# Patient Record
Sex: Female | Born: 1955
Health system: Southern US, Community
[De-identification: ages and names within clinical notes are randomized; demographics above are authoritative.]

## PROBLEM LIST (undated history)

## (undated) DIAGNOSIS — I341 Nonrheumatic mitral (valve) prolapse: Secondary | ICD-10-CM

## (undated) DIAGNOSIS — E7800 Pure hypercholesterolemia, unspecified: Secondary | ICD-10-CM

## (undated) DIAGNOSIS — E785 Hyperlipidemia, unspecified: Secondary | ICD-10-CM

## (undated) DIAGNOSIS — I10 Essential (primary) hypertension: Secondary | ICD-10-CM

## (undated) DIAGNOSIS — N950 Postmenopausal bleeding: Secondary | ICD-10-CM

## (undated) HISTORY — DX: Essential (primary) hypertension: I10

## (undated) HISTORY — DX: Pure hypercholesterolemia, unspecified: E78.00

## (undated) HISTORY — PX: VEIN SURGERY: SHX48

## (undated) HISTORY — PX: ROOT CANAL: SHX2363

## (undated) HISTORY — DX: Nonrheumatic mitral (valve) prolapse: I34.1

## (undated) HISTORY — DX: Hyperlipidemia, unspecified: E78.5

## (undated) HISTORY — DX: Postmenopausal bleeding: N95.0

---

## 1998-12-07 ENCOUNTER — Other Ambulatory Visit: Admission: RE | Admit: 1998-12-07 | Discharge: 1998-12-07 | Payer: Self-pay | Admitting: Obstetrics and Gynecology

## 1999-11-05 ENCOUNTER — Other Ambulatory Visit: Admission: RE | Admit: 1999-11-05 | Discharge: 1999-11-05 | Payer: Self-pay | Admitting: Gynecology

## 2000-04-09 ENCOUNTER — Observation Stay (HOSPITAL_COMMUNITY): Admission: AD | Admit: 2000-04-09 | Discharge: 2000-04-09 | Payer: Self-pay | Admitting: Gynecology

## 2000-04-18 ENCOUNTER — Observation Stay (HOSPITAL_COMMUNITY): Admission: AD | Admit: 2000-04-18 | Discharge: 2000-04-19 | Payer: Self-pay | Admitting: Gynecology

## 2000-05-09 ENCOUNTER — Inpatient Hospital Stay (HOSPITAL_COMMUNITY): Admission: AD | Admit: 2000-05-09 | Discharge: 2000-05-09 | Payer: Self-pay | Admitting: Gynecology

## 2000-05-09 ENCOUNTER — Encounter: Payer: Self-pay | Admitting: Gynecology

## 2000-05-10 ENCOUNTER — Inpatient Hospital Stay (HOSPITAL_COMMUNITY): Admission: AD | Admit: 2000-05-10 | Discharge: 2000-05-13 | Payer: Self-pay | Admitting: Gynecology

## 2000-05-10 ENCOUNTER — Encounter (INDEPENDENT_AMBULATORY_CARE_PROVIDER_SITE_OTHER): Payer: Self-pay | Admitting: Specialist

## 2000-06-17 HISTORY — PX: TUBAL LIGATION: SHX77

## 2000-06-20 ENCOUNTER — Other Ambulatory Visit: Admission: RE | Admit: 2000-06-20 | Discharge: 2000-06-20 | Payer: Self-pay | Admitting: Gynecology

## 2001-01-27 DIAGNOSIS — I341 Nonrheumatic mitral (valve) prolapse: Secondary | ICD-10-CM

## 2001-01-27 HISTORY — DX: Nonrheumatic mitral (valve) prolapse: I34.1

## 2002-06-17 HISTORY — PX: BUNIONECTOMY: SHX129

## 2003-06-18 HISTORY — PX: DILATION AND CURETTAGE OF UTERUS: SHX78

## 2003-12-12 ENCOUNTER — Other Ambulatory Visit: Admission: RE | Admit: 2003-12-12 | Discharge: 2003-12-12 | Payer: Self-pay | Admitting: Family Medicine

## 2005-01-03 ENCOUNTER — Other Ambulatory Visit: Admission: RE | Admit: 2005-01-03 | Discharge: 2005-01-03 | Payer: Self-pay | Admitting: Family Medicine

## 2005-07-02 ENCOUNTER — Encounter: Payer: Self-pay | Admitting: Cardiovascular Disease

## 2006-01-13 ENCOUNTER — Other Ambulatory Visit: Admission: RE | Admit: 2006-01-13 | Discharge: 2006-01-13 | Payer: Self-pay | Admitting: Gynecology

## 2006-04-04 ENCOUNTER — Encounter (INDEPENDENT_AMBULATORY_CARE_PROVIDER_SITE_OTHER): Payer: Self-pay | Admitting: Specialist

## 2006-04-04 ENCOUNTER — Ambulatory Visit (HOSPITAL_BASED_OUTPATIENT_CLINIC_OR_DEPARTMENT_OTHER): Admission: RE | Admit: 2006-04-04 | Discharge: 2006-04-04 | Payer: Self-pay | Admitting: Gynecology

## 2009-01-17 ENCOUNTER — Other Ambulatory Visit: Admission: RE | Admit: 2009-01-17 | Discharge: 2009-01-17 | Payer: Self-pay | Admitting: Family Medicine

## 2009-08-28 ENCOUNTER — Other Ambulatory Visit: Admission: RE | Admit: 2009-08-28 | Discharge: 2009-08-28 | Payer: Self-pay | Admitting: Family Medicine

## 2010-06-06 ENCOUNTER — Ambulatory Visit: Payer: Self-pay | Admitting: Cardiovascular Disease

## 2010-11-02 NOTE — Discharge Summary (Signed)
[facility]  Patient:    Brooke [name], Brooke [name]                          MRN: [id number] Adm. Date:  04/10/00 Attending:  Gaetano Hawthorne. Lily Peer, M.D.                           Discharge Summary  OBSERVATION NOTE  HISTORY:                      The patient is a 55 year old gravida 5, para 3, Ab1, LMP July 25, 1999, with estimated date of confinement of November 14th and currently 37-2/[redacted] weeks gestation, who presented to [facility] at 2130 hours on October 24th complaining of contractions.  She was monitored. Contractions were found to be three and a half minutes apart and then one to nine minutes apart, with fetal heart rate of 130 to 140 beats per minute. Examination by the nurse in triage documented her cervix as 3-cm dilated, 50% effaced and questionable presentation and membranes intact.  HOSPITAL COURSE:              She was admitted and after ambulating on the ward, was monitored.  Her contractions began to decrease in intensity and frequency and throughout most of the night, her contractions averaged perhaps one to two per hour, very mild.  A reassuring fetal heart tracing was evident. On re-exam of her cervix this morning at 0500 hours, external os was 2-cm dilated, internal cervical os was closed, presentation was vertex and was ballotable and her vital signs were stable.  It was discussed with her that since she is not in labor, that we were planning on discharging her.  Due to the fact that she had positive group B strep status when she came in, if she were going to go into true labor, she was started on penicillin per protocol. Her IV fluids and antibiotics were discontinued this morning and she will be released home.  FOLLOWUP:                     She is to follow up in the office in 72 hours for a routine visit.  DISCHARGE INSTRUCTIONS:       If anything changes, she will return back to the office or to the hospital for remonitoring and  reexamination.  Labor precautions and instructions were provided. DD:  04/10/00 TD:  04/10/00 Job: [id number]

## 2010-11-02 NOTE — Discharge Summary (Signed)
[facility]  Patient:    Brooke [name], Brooke [name]                        MRN: [id number] Adm. Date:  [id number] Disc. Date: [id number] Attending:  Douglass Rivers Dictator:   Antony Contras, [facility]                           Discharge Summary  DISCHARGE DIAGNOSES:          Intrauterine pregnancy at 72 3/7 weeks, history of advanced maternal age.  PROCEDURE:                    Normal spontaneous vaginal delivery over intact perineum of viable infant, bilateral tubal sterilization.  HISTORY OF PRESENT ILLNESS:   Patient is a 55 year old gravida 5, para 3-0-1-3 with LMP July 25, 1999, [facility] April 30, 2000.  Prenatal risk factors include advanced maternal age.  Patient did decline amniocentesis, [facility]SA[facility].  PRENATAL LABORATORIES:        Blood type A-.  Antibody screen negative.  RPR, HBSAG, HIV nonreactive.  Rubella immune.  HOSPITAL COURSE:              Patient was admitted for induction of labor. She rapidly progressed to complete dilatation and delivered an Apgar 9/9 female infant over an intact perineum.  Nuchal cord x 1.  Particulate fluid. Pediatric team was in attendance.  Postpartum course was uncomplicated.  She did undergo postpartum sterilization on her first postpartum day which was performed by Dr. Farrel Gobble under general anesthesia.  Findings included normal tubes and ovaries.  Her postpartum course remained benign.  She remained afebrile.  She did require two Tylenol No. 3 for pain postoperative tubal sterilization, but was able to be discharged in satisfactory condition on her second postpartum day.  Baby was Rh negative so she did not need to receive RhoGAM.  LABORATORIES:                 CBC:  Hematocrit 31.1, hemoglobin 11.0, WBC 10.5, platelets 122.  DISPOSITION:                  Follow up in the office in six weeks.  Continue prenatal vitamins and iron.  Motrin, Tylox for pain. DD:  06/06/00 TD:  06/06/00 Job: [id number]

## 2010-11-02 NOTE — Op Note (Signed)
NAMEMARNEY, Brooke [name]                 ACCOUNT NO.:  [id number]   MEDICAL RECORD NO.:  [id number]          PATIENT TYPE:  AMB   LOCATION:  [facility]                         FACILITY:  [facility]   PHYSICIAN:  Timothy P. Fontaine, M.D.DATE OF BIRTH:  December 07, 1955   DATE OF PROCEDURE:  DATE OF DISCHARGE:                                 OPERATIVE REPORT   PREOPERATIVE DIAGNOSES:  1. Menorrhagia.  2. Endometrial polyp.   POSTOPERATIVE DIAGNOSES:  1. Menorrhagia.  2. Endometrial polyp.   PROCEDURE:  Hysteroscopy D&C and endometrial polypectomy resectoscopic.   SURGEON:  Fontaine.  Anesthetic general.   COMPLICATIONS:  None.   ESTIMATED BLOOD LOSS:  Minimal sorbitol discrepancy less than 30 mL   SPECIMEN:  1. Endometrial polyp.  2. Endometrial curetting.   FINDINGS:  EUA external BUS of vagina normal.  Cervix grossly normal.  Uterus normal size, shape and contour, retroverted, adnexa without masses.  Hysteroscopic large endometrial polyp posterior mid-endometrial cavity.  Hysteroscopy otherwise was normal.  Adequate noting right and left tubal  ostia fundus, anterior-posterior uterine surfaces, lower uterine segment  endocervical canal all visualized   PROCEDURE:  The patient was taken to the operating room having received SBE  antibiotic prophylaxsis, underwent general anesthesia was placed low dorsal  lithotomy position, received a perineal vaginal preparation with Betadine  solution.  Bladder emptied with in-and-out Foley catheterization.  EUA  performed.  Cervix was visualized with a weighted speculum, anterior lip  grasped with single-tooth tenaculum.  Cervix was gently gradually dilated to  admit the operative hysteroscope.  Hysteroscopy was performed with findings  noted above.  Using the right-angle resectoscope loop, the endometrial polyp  was resected at its base and then removed as a separate specimen, sent to  pathology.  A sharp curettage was then performed and the specimen again  was  sent to pathology.  Re-hysteroscopy showed an empty cavity, good distension,  no evidence of perforation, reported sorbitol discrepancy was 30 mL.  The  single-tooth tenaculum was removed.  There was some oozing at the tenaculum  site and a 3-0 chromic interrupted suture was placed to achieve hemostasis.  The weighted speculum was removed.  The patient placed in supine position,  awakened without difficulty, taken to recovery room in good condition having  tolerated the procedure well.      Timothy P. Fontaine, M.D.  Electronically Signed     TPF/MEDQ  D:  04/04/2006  T:  04/05/2006  Job:  [id number]

## 2010-11-02 NOTE — H&P (Signed)
Brooke [name], BAGHERI                 ACCOUNT NO.:  [id number]   MEDICAL RECORD NO.:  [id number]          PATIENT TYPE:  AMB   LOCATION:  [facility]                         FACILITY:  [facility]   PHYSICIAN:  Timothy P. Fontaine, M.D.DATE OF BIRTH:  1955/08/29   DATE OF ADMISSION:  04/04/2006  DATE OF DISCHARGE:                                HISTORY & PHYSICAL   CHIEF COMPLAINT:  Menorrhagia, endometrial polyp.   HISTORY OF PRESENT ILLNESS:  A 55 year old gravida 5, para 4, abortus 1  female with menorrhagia who underwent outpatient evaluation to include TSH  and prolactin which were normal, CBC which showed a hemoglobin of 13.4, and  a sonohysterogram which showed an endometrial polyp.  The patient is  admitted at this time for hysteroscopy, D&C, and removal of her polyp.   PAST MEDICAL HISTORY:  Elevated cholesterol, history of mitral valve  prolapse.   PAST SURGICAL HISTORY:  Tubal sterilization.   MEDICATIONS:  Lipitor, vitamin E, multivitamin.   REVIEW OF SYSTEMS:  Noncontributory.   FAMILY HISTORY:  Noncontributory.   SOCIAL HISTORY:  Noncontributory.   PHYSICAL EXAMINATION:  GENERAL:  Per anesthesia.  PELVIC:  External BUS and vagina normal.  Cervix normal.  Uterus normal  size, midline, and mobile, and nontender.  Adnexa without masses or  tenderness.   ASSESSMENT:  A 55 year old gravida 5, para 4, abortus 1 female with  increasing menorrhagia.  Sonohysterogram shows a clear endometrial polyp.  Options for management were reviewed with the patient as noted in my January 23, 2006, office note.  The patient was to proceed with hysteroscopy, D&C,  and removal of the polyp.  I reviewed with the patient the expected  intraoperative and postoperative courses, anesthesia, instrumentation, use  of the resectoscope, and sharp curettage.  The risks of bleeding,  transfusion, infection, uterine perforation, damage to internal organs  including bowel, bladder, ureters, vessels, and nerves  necessitating major  exploratory reparative surgeries and future reparative surgeries including  ostomy formation was all discussed, understood, and accepted.  The patient understands that her menorrhagia or irregular bleeding may  persist, change, or worsen following the procedure.  She understands and  accepts this.  The patient's questions are answered to her satisfaction and  she is ready to proceed with surgery.      Timothy P. Fontaine, M.D.  Electronically Signed     TPF/MEDQ  D:  04/01/2006  T:  04/01/2006  Job:  [id number]

## 2010-11-02 NOTE — Op Note (Signed)
[facility]  Patient:    Brooke [name] [name], Brooke [name]                        MRN: [id number] Proc. Date: 05/12/00 Adm. Date:  [id number] Attending:  Douglass Rivers                           Operative Report  PREOPERATIVE DIAGNOSES:       1. Postpartum.                               2. Undesired fertility.  POSTOPERATIVE DIAGNOSES:      1. Postpartum.                               2. Undesired fertility.  OPERATION:                    Bilateral tubal ligation, modified Pomeroy.  SURGEON:                      Douglass Rivers, M.D.  ASSISTANT:  ANESTHESIA:                   General anesthesia.  ESTIMATED BLOOD LOSS:         Minimal.  FINDINGS:                     Normal tubes and ovaries.  COMPLICATIONS:                None.  PATHOLOGY:                    Mid portion of left and right tube.  DESCRIPTION OF PROCEDURE:     The patient was taken to the operating room, placed in the supine position, prepped and draped in the usual sterile fashion.  Her epidural anesthesia had been dosed up; however, despite this she failed to have adequate anesthesia on the right side of the incision.  Based on this, she was then placed under general anesthesia.  An infraumbilical incision was made with the scalpel and carried through the underlying layer of fascia sharply.  The fascia was scored and the incision was extended laterally with the Mayo scissors. The peritoneal incision was also extended laterally. An examining finger was placed in the incision.  The uterus was palpated. The right tubo-ovarian ligament was brought to the incision and the tube was grasped with the Babcock, walked out to the fimbriated end and mid portion of the tube was then doubly tied with 2-0 plain and excised.  It was noted to be hemostatic. The endosalpinx was crossclamped and also excised in the similar fashion. The left tubo-ovarian ligament was identified. The tube was then walked out to the  fimbriated end. The mid portion of the tube was also doubly tied and excised. Both tubes were noted to be hemostatic prior to returning to the abdomen.  The fascia and peritoneum were closed in a running locked suture of 0 Vicryl.  The skin was closed with DermaBond. The incision was injected with 0.25% Marcaine solution. The patient tolerated the procedure well. Sponge, lap and needle counts correct x 2 and she was transferred to the PACU in stable condition. DD:  05/12/00 TD:  05/12/00 Job:  [id number]

## 2011-08-05 ENCOUNTER — Telehealth: Payer: Self-pay | Admitting: Cardiovascular Disease

## 2011-08-05 NOTE — Telephone Encounter (Signed)
New msg: Pt calling wanting to know if pt needs fasting lab work done prior to pt appt on 09/17/11. Please return pt call to discuss further.

## 2011-08-05 NOTE — Telephone Encounter (Signed)
Patient would like to know if she needs to have fasting labs prior O/V with Dr. Elease Hashimoto on 09/17/11. Pt does not remember if she has labs done prior visit . Patient is aware that this message will be send to MD's nurse to see what labs need to be done if any prior appointment.

## 2011-08-12 ENCOUNTER — Encounter: Payer: Self-pay | Admitting: *Deleted

## 2011-08-12 ENCOUNTER — Telehealth: Payer: Self-pay | Admitting: *Deleted

## 2011-08-12 DIAGNOSIS — I10 Essential (primary) hypertension: Secondary | ICD-10-CM

## 2011-08-12 DIAGNOSIS — I341 Nonrheumatic mitral (valve) prolapse: Secondary | ICD-10-CM

## 2011-08-12 DIAGNOSIS — I8390 Asymptomatic varicose veins of unspecified lower extremity: Secondary | ICD-10-CM

## 2011-08-12 DIAGNOSIS — E785 Hyperlipidemia, unspecified: Secondary | ICD-10-CM

## 2011-08-12 NOTE — Telephone Encounter (Signed)
Pt called/ labs ordered for yearly app.

## 2011-09-04 ENCOUNTER — Encounter: Payer: Self-pay | Admitting: *Deleted

## 2011-09-16 ENCOUNTER — Other Ambulatory Visit (INDEPENDENT_AMBULATORY_CARE_PROVIDER_SITE_OTHER): Payer: 59

## 2011-09-16 DIAGNOSIS — E785 Hyperlipidemia, unspecified: Secondary | ICD-10-CM

## 2011-09-16 DIAGNOSIS — I059 Rheumatic mitral valve disease, unspecified: Secondary | ICD-10-CM

## 2011-09-16 DIAGNOSIS — I341 Nonrheumatic mitral (valve) prolapse: Secondary | ICD-10-CM

## 2011-09-16 DIAGNOSIS — I10 Essential (primary) hypertension: Secondary | ICD-10-CM

## 2011-09-16 LAB — BASIC METABOLIC PANEL
CO2: 29 mEq/L (ref 19–32)
Calcium: 9.5 mg/dL (ref 8.4–10.5)
Creatinine, Ser: 0.8 mg/dL (ref 0.4–1.2)
Glucose, Bld: 96 mg/dL (ref 70–99)

## 2011-09-16 LAB — HEPATIC FUNCTION PANEL
AST: 34 U/L (ref 0–37)
Alkaline Phosphatase: 71 U/L (ref 39–117)
Total Bilirubin: 0.4 mg/dL (ref 0.3–1.2)

## 2011-09-16 LAB — LIPID PANEL: Total CHOL/HDL Ratio: 3

## 2011-09-17 ENCOUNTER — Ambulatory Visit (INDEPENDENT_AMBULATORY_CARE_PROVIDER_SITE_OTHER): Payer: 59 | Admitting: Cardiovascular Disease

## 2011-09-17 ENCOUNTER — Encounter: Payer: Self-pay | Admitting: Cardiovascular Disease

## 2011-09-17 VITALS — BP 123/85 | HR 62 | Ht 65.0 in | Wt 156.0 lb

## 2011-09-17 DIAGNOSIS — E785 Hyperlipidemia, unspecified: Secondary | ICD-10-CM

## 2011-09-17 DIAGNOSIS — I341 Nonrheumatic mitral (valve) prolapse: Secondary | ICD-10-CM

## 2011-09-17 DIAGNOSIS — I059 Rheumatic mitral valve disease, unspecified: Secondary | ICD-10-CM

## 2011-09-17 DIAGNOSIS — I10 Essential (primary) hypertension: Secondary | ICD-10-CM

## 2011-09-17 DIAGNOSIS — I8390 Asymptomatic varicose veins of unspecified lower extremity: Secondary | ICD-10-CM

## 2011-09-17 MED ORDER — ATORVASTATIN CALCIUM 20 MG PO TABS: 20.0000 mg | ORAL_TABLET | Freq: Every day | ORAL | Status: DC

## 2011-09-17 NOTE — Assessment & Plan Note (Signed)
Her mitral prolapse sounds very stable. We will anticipate doing another echocardiogram in a year or so.

## 2011-09-17 NOTE — Assessment & Plan Note (Signed)
I refilled her atorvastatin.  I'll see her again in one year. We'll check fasting labs at that time.

## 2011-09-17 NOTE — Progress Notes (Signed)
    Brooke White Date of Birth  September 25, 1955 [facility]     [place] [facility]  [address]    [address]   [address] [place], [place]  [address]    [place], [place]  [address] [phone]  Fax  [phone]  [phone]  Fax [phone]  Problem List: 1. Mitral Valve prolapse 2. Hyperlipidemia   History of Present Illness:  Brooke White is a 56 year old female with a history of mitral valve prolapse, hyperlipidemia, and hypertension. She's done very well since I last saw her a year and half ago. She has not had any episodes of chest pain or shortness of breath. She's been able to do all of her normal activities without any significant problems.  Current Outpatient Prescriptions on File Prior to Visit  Medication Sig Dispense Refill  . atorvastatin (LIPITOR) 20 MG tablet Take 20 mg by mouth daily.      Marland Kitchen DISCONTD: cetirizine (ZYRTEC) 10 MG tablet Take 10 mg by mouth as needed.        No Known Allergies  Past Medical History  Diagnosis Date  . MVP (mitral valve prolapse) 01/27/2001    LAST ECHO 07/02/2005 Impression normal LV function, mild MVP with trivial MR, mild tricuspid regurgitation, trivial pulmonic insufficiency.   Marland Kitchen HTN (hypertension)   . Hyperlipemia     No past surgical history on file.  History  Smoking status  . Never Smoker   Smokeless tobacco  . Not on file    History  Alcohol Use: Not on file    Family History  Problem Relation Age of Onset  . Hypertension    . Heart disease      Reviw of Systems:  Reviewed in the HPI.  All other systems are negative.  Physical Exam: Blood pressure 123/85, pulse 62, height 5\' 5"  (1.651 m), weight 156 lb (70.761 kg). General: Well developed, well nourished, in no acute distress.  Head: Normocephalic, atraumatic, sclera non-icteric, mucus membranes are moist,   Neck: Supple. Carotids are 2 + without bruits. No JVD  Lungs: Clear bilaterally to auscultation.  Heart: regular rate.  normal  S1 S2.  There is a very soft systolic murmur at the left sternal border.  Abdomen: Soft, non-tender, non-distended with normal bowel sounds. No hepatomegaly. No rebound/guarding. No masses.  Msk:  Strength and tone are normal  Extremities: No clubbing or cyanosis. No edema.  Distal pedal pulses are 2+ and equal bilaterally.  Neuro: Alert and oriented X 3. Moves all extremities spontaneously.  Psych:  Responds to questions appropriately with a normal affect.  ECG: Normal sinus rhythm at 66 beats a minute. She has poor R-wave progression which is likely due to lead placement.  Assessment / Plan:

## 2011-09-17 NOTE — Patient Instructions (Signed)
Your physician wants you to follow-up in: 1 YEAR You will receive a reminder letter in the mail two months in advance. If you don't receive a letter, please call our office to schedule the follow-up appointment.  

## 2012-04-27 LAB — HM PAP SMEAR: HM Pap smear: NEGATIVE

## 2012-04-27 LAB — TSH: TSH: 1.8 u[IU]/mL (ref <=5.90)

## 2012-06-17 DIAGNOSIS — N950 Postmenopausal bleeding: Secondary | ICD-10-CM

## 2012-06-17 HISTORY — DX: Postmenopausal bleeding: N95.0

## 2012-07-17 ENCOUNTER — Encounter (HOSPITAL_COMMUNITY): Payer: Self-pay | Admitting: Pharmacist

## 2012-07-18 HISTORY — PX: HYSTEROSCOPY: SHX211

## 2012-07-27 ENCOUNTER — Encounter (HOSPITAL_COMMUNITY): Payer: Self-pay | Admitting: *Deleted

## 2012-08-02 NOTE — H&P (Addendum)
Brooke White is an 57 y.o. female peri-menopausal with history of menorrhagia.  U/S with SHG notable for a posterior wall defect consistant with endometrial polyp measuring 2.6x1.3x2.4cm.  Pt has history of endometrial polyp resected in past.   Pertinent Gynecological History: Menses: irregular, with missed cycles Bleeding: dysfunctional uterine bleeding Contraception: abstinence and tubal ligation DES exposure: unknown Blood transfusions: none Sexually transmitted diseases: no past history Previous GYN Procedures: hysteroscopy, tubal ligation  Last mammogram: normal Date: 2011 Last pap: normal Date: 2013 OB History: G5, P4014   Menstrual History: Menarche age: 35  Patient's last menstrual period was 03/11/2012.    Past Medical History  Diagnosis Date  . MVP (mitral valve prolapse) 01/27/2001    LAST ECHO 07/02/2005 Impression normal LV function, mild MVP with trivial MR, mild tricuspid regurgitation, trivial pulmonic insufficiency.   . Hyperlipemia   . HTN (hypertension)     blood pressure under control-meds stopped approx 2011    Past Surgical History  Procedure Laterality Date  . Dilation and curettage of uterus  2005  . Bunionectomy  2004  . Tubal ligation  2002    Family History  Problem Relation Age of Onset  . Hypertension    . Heart disease      Social History:  reports that she has never smoked. She does not have any smokeless tobacco history on file. She reports that she does not drink alcohol or use illicit drugs.  Allergies: No Known Allergies  No prescriptions prior to admission    Review of Systems  Constitutional: Negative.   HENT: Negative.   Eyes: Negative.   Respiratory: Negative.   Cardiovascular: Negative.   Gastrointestinal: Negative.   Genitourinary: Negative.   Musculoskeletal: Negative.   Skin: Negative.   Neurological: Negative.   Endo/Heme/Allergies: Negative.   Psychiatric/Behavioral: Negative.     Height 5' 4.5" (1.638 m),  weight 68.04 kg (150 lb), last menstrual period 03/11/2012. Physical Exam  Constitutional: She is oriented to person, place, and time. She appears well-developed and well-nourished.  HENT:  Head: Normocephalic and atraumatic.  Eyes: Conjunctivae and EOM are normal. Pupils are equal, round, and reactive to light.  Neck: Normal range of motion. Neck supple.  Cardiovascular: Normal rate and regular rhythm.   Respiratory: Effort normal and breath sounds normal.  GI: Soft. Bowel sounds are normal.  Genitourinary: Rectum normal and vagina normal. No breast swelling, tenderness, discharge or bleeding. There is no rash, tenderness, lesion or injury on the right labia. There is no rash, tenderness, lesion or injury on the left labia. Uterus is not deviated, not enlarged and not tender. Cervix exhibits no motion tenderness and no discharge. Right adnexum displays no mass. Left adnexum displays no mass.  Musculoskeletal: Normal range of motion.  Lymphadenopathy:       Right: No inguinal adenopathy present.       Left: No inguinal adenopathy present.  Neurological: She is alert and oriented to person, place, and time. She has normal reflexes.  Skin: Skin is warm and dry.  Psychiatric: She has a normal mood and affect. Her behavior is normal. Judgment and thought content normal.    No results found for this or any previous visit (from the past 24 hour(s)).  No results found.  Assessment/Plan: Recurrent endometrial polyp, for resection.  Mild MVP with trivial regurgitaion  Naydeen Speirs H 08/02/2012, 9:39 PM No change in H&P

## 2012-08-03 ENCOUNTER — Encounter (HOSPITAL_COMMUNITY): Payer: Self-pay

## 2012-08-03 ENCOUNTER — Ambulatory Visit (HOSPITAL_COMMUNITY): Payer: 59

## 2012-08-03 ENCOUNTER — Encounter (HOSPITAL_COMMUNITY)
Admission: RE | Disposition: A | Discharge status: Home / Self Care | Payer: Self-pay | Source: Ambulatory Visit | Attending: Gynecology

## 2012-08-03 ENCOUNTER — Ambulatory Visit (HOSPITAL_COMMUNITY)
Admission: RE | Admit: 2012-08-03 | Discharge: 2012-08-03 | Disposition: A | Discharge status: Home / Self Care | Payer: 59 | Source: Ambulatory Visit | Attending: Gynecology | Admitting: Gynecology

## 2012-08-03 ENCOUNTER — Encounter (HOSPITAL_COMMUNITY): Payer: Self-pay | Admitting: *Deleted

## 2012-08-03 DIAGNOSIS — N950 Postmenopausal bleeding: Secondary | ICD-10-CM | POA: Insufficient documentation

## 2012-08-03 DIAGNOSIS — N840 Polyp of corpus uteri: Secondary | ICD-10-CM | POA: Insufficient documentation

## 2012-08-03 DIAGNOSIS — I059 Rheumatic mitral valve disease, unspecified: Secondary | ICD-10-CM | POA: Insufficient documentation

## 2012-08-03 HISTORY — PX: DILATATION & CURRETTAGE/HYSTEROSCOPY WITH RESECTOCOPE: SHX5572

## 2012-08-03 LAB — CBC
Hemoglobin: 14.3 g/dL (ref 12.0–15.0)
MCH: 28.5 pg (ref 26.0–34.0)
MCHC: 33.4 g/dL (ref 30.0–36.0)
Platelets: 158 10*3/uL (ref 150–400)
RBC: 5.01 MIL/uL (ref 3.87–5.11)

## 2012-08-03 SURGERY — DILATATION & CURETTAGE/HYSTEROSCOPY WITH RESECTOCOPE
Anesthesia: General | Site: Vagina | Wound class: Clean Contaminated

## 2012-08-03 MED ORDER — LIDOCAINE HCL (CARDIAC) 20 MG/ML IV SOLN
INTRAVENOUS | Status: AC
  Filled 2012-08-03: qty 5

## 2012-08-03 MED ORDER — BUPIVACAINE-EPINEPHRINE 0.25% -1:200000 IJ SOLN
INTRAMUSCULAR | Status: DC | PRN
  Administered 2012-08-03: 10 mL

## 2012-08-03 MED ORDER — MEPERIDINE HCL 25 MG/ML IJ SOLN: 6.2500 mg | INTRAMUSCULAR | Status: DC | PRN

## 2012-08-03 MED ORDER — PROPOFOL 10 MG/ML IV EMUL
INTRAVENOUS | Status: AC
  Filled 2012-08-03: qty 20

## 2012-08-03 MED ORDER — LACTATED RINGERS IV SOLN: INTRAVENOUS | Status: DC

## 2012-08-03 MED ORDER — MIDAZOLAM HCL 5 MG/5ML IJ SOLN
INTRAMUSCULAR | Status: DC | PRN
  Administered 2012-08-03: 2 mg via INTRAVENOUS

## 2012-08-03 MED ORDER — FENTANYL CITRATE 0.05 MG/ML IJ SOLN
INTRAMUSCULAR | Status: AC
  Filled 2012-08-03: qty 2

## 2012-08-03 MED ORDER — BUPIVACAINE HCL (PF) 0.25 % IJ SOLN
INTRAMUSCULAR | Status: AC
  Filled 2012-08-03: qty 30

## 2012-08-03 MED ORDER — ONDANSETRON HCL 4 MG/2ML IJ SOLN
INTRAMUSCULAR | Status: AC
  Filled 2012-08-03: qty 2

## 2012-08-03 MED ORDER — LACTATED RINGERS IV SOLN
INTRAVENOUS | Status: DC
  Administered 2012-08-03 (×2): via INTRAVENOUS

## 2012-08-03 MED ORDER — KETOROLAC TROMETHAMINE 30 MG/ML IJ SOLN
INTRAMUSCULAR | Status: AC
  Filled 2012-08-03: qty 1

## 2012-08-03 MED ORDER — MIDAZOLAM HCL 2 MG/2ML IJ SOLN: 0.5000 mg | Freq: Once | INTRAMUSCULAR | Status: DC | PRN

## 2012-08-03 MED ORDER — LIDOCAINE HCL (CARDIAC) 20 MG/ML IV SOLN
INTRAVENOUS | Status: DC | PRN
  Administered 2012-08-03: 40 mg via INTRAVENOUS
  Administered 2012-08-03 (×2): 30 mg via INTRAVENOUS

## 2012-08-03 MED ORDER — DEXAMETHASONE SODIUM PHOSPHATE 4 MG/ML IJ SOLN
INTRAMUSCULAR | Status: DC | PRN
  Administered 2012-08-03: 10 mg via INTRAVENOUS

## 2012-08-03 MED ORDER — LIDOCAINE HCL 2 % IJ SOLN
INTRAMUSCULAR | Status: AC
  Filled 2012-08-03: qty 20

## 2012-08-03 MED ORDER — PHENYLEPHRINE HCL 10 MG/ML IJ SOLN
INTRAMUSCULAR | Status: DC | PRN
  Administered 2012-08-03: 160 ug via INTRAVENOUS
  Administered 2012-08-03: 40 ug via INTRAVENOUS

## 2012-08-03 MED ORDER — EPHEDRINE 5 MG/ML INJ
INTRAVENOUS | Status: AC
  Filled 2012-08-03: qty 10

## 2012-08-03 MED ORDER — PROPOFOL 10 MG/ML IV EMUL
INTRAVENOUS | Status: DC | PRN
  Administered 2012-08-03: 180 mg via INTRAVENOUS

## 2012-08-03 MED ORDER — SUCCINYLCHOLINE CHLORIDE 20 MG/ML IJ SOLN
INTRAMUSCULAR | Status: DC | PRN
  Administered 2012-08-03: 40 mg via INTRAVENOUS

## 2012-08-03 MED ORDER — BUPIVACAINE-EPINEPHRINE PF 0.25-1:200000 % IJ SOLN
INTRAMUSCULAR | Status: AC
  Filled 2012-08-03: qty 30

## 2012-08-03 MED ORDER — ONDANSETRON HCL 4 MG/2ML IJ SOLN
INTRAMUSCULAR | Status: DC | PRN
  Administered 2012-08-03: 4 mg via INTRAVENOUS

## 2012-08-03 MED ORDER — PROMETHAZINE HCL 25 MG/ML IJ SOLN: 6.2500 mg | INTRAMUSCULAR | Status: DC | PRN

## 2012-08-03 MED ORDER — FENTANYL CITRATE 0.05 MG/ML IJ SOLN
INTRAMUSCULAR | Status: DC | PRN
  Administered 2012-08-03 (×2): 50 ug via INTRAVENOUS

## 2012-08-03 MED ORDER — FENTANYL CITRATE 0.05 MG/ML IJ SOLN: 25.0000 ug | INTRAMUSCULAR | Status: DC | PRN

## 2012-08-03 MED ORDER — EPHEDRINE SULFATE 50 MG/ML IJ SOLN
INTRAMUSCULAR | Status: DC | PRN
  Administered 2012-08-03: 20 mg via INTRAVENOUS

## 2012-08-03 MED ORDER — MIDAZOLAM HCL 2 MG/2ML IJ SOLN
INTRAMUSCULAR | Status: AC
  Filled 2012-08-03: qty 2

## 2012-08-03 MED ORDER — GLYCINE 1.5 % IR SOLN
Status: DC | PRN
  Administered 2012-08-03: 3000 mL

## 2012-08-03 MED ORDER — SILVER NITRATE-POT NITRATE 75-25 % EX MISC
CUTANEOUS | Status: AC
  Filled 2012-08-03: qty 10

## 2012-08-03 MED ORDER — PHENYLEPHRINE 40 MCG/ML (10ML) SYRINGE FOR IV PUSH (FOR BLOOD PRESSURE SUPPORT)
PREFILLED_SYRINGE | INTRAVENOUS | Status: AC
  Filled 2012-08-03: qty 5

## 2012-08-03 MED ORDER — DOXYCYCLINE HYCLATE 100 MG IV SOLR
100.0000 mg | Freq: Once | INTRAVENOUS | Status: AC
  Administered 2012-08-03: 100 mg via INTRAVENOUS
  Filled 2012-08-03: qty 100

## 2012-08-03 MED ORDER — KETOROLAC TROMETHAMINE 30 MG/ML IJ SOLN: 15.0000 mg | Freq: Once | INTRAMUSCULAR | Status: DC | PRN

## 2012-08-03 MED ORDER — SUCCINYLCHOLINE CHLORIDE 20 MG/ML IJ SOLN
INTRAMUSCULAR | Status: AC
  Filled 2012-08-03: qty 10

## 2012-08-03 MED ORDER — KETOROLAC TROMETHAMINE 30 MG/ML IJ SOLN
INTRAMUSCULAR | Status: DC | PRN
  Administered 2012-08-03: 30 mg via INTRAVENOUS

## 2012-08-03 SURGICAL SUPPLY — 15 items
CATH ROBINSON RED A/P 16FR (CATHETERS) ×2 IMPLANT
CONTAINER PREFILL 10% NBF 60ML (FORM) ×2 IMPLANT
DRESSING TELFA 8X3 (GAUZE/BANDAGES/DRESSINGS) ×2 IMPLANT
ELECT REM PT RETURN 9FT ADLT (ELECTROSURGICAL)
ELECTRODE REM PT RTRN 9FT ADLT (ELECTROSURGICAL) IMPLANT
GLOVE BIOGEL M 6.5 STRL (GLOVE) ×2 IMPLANT
GLOVE BIOGEL PI IND STRL 7.0 (GLOVE) ×1 IMPLANT
GLOVE BIOGEL PI INDICATOR 7.0 (GLOVE) ×1
GOWN STRL REIN XL XLG (GOWN DISPOSABLE) ×4 IMPLANT
LOOP ANGLED CUTTING 22FR (CUTTING LOOP) IMPLANT
NEEDLE HYPO 21X1.5 SAFETY (NEEDLE) ×2 IMPLANT
PACK HYSTEROSCOPY LF (CUSTOM PROCEDURE TRAY) ×2 IMPLANT
PAD OB MATERNITY 4.3X12.25 (PERSONAL CARE ITEMS) ×2 IMPLANT
TOWEL OR 17X24 6PK STRL BLUE (TOWEL DISPOSABLE) ×4 IMPLANT
WATER STERILE IRR 1000ML POUR (IV SOLUTION) ×2 IMPLANT

## 2012-08-03 NOTE — Anesthesia Preprocedure Evaluation (Addendum)
Anesthesia Evaluation  Patient identified by MRN, date of birth, ID band Patient awake    Reviewed: Allergy & Precautions, H&P , Patient's Chart, lab work & pertinent test results, reviewed documented beta blocker date and time   History of Anesthesia Complications Negative for: history of anesthetic complications  Airway Mallampati: III TM Distance: <3 FB Neck ROM: full  Mouth opening: Limited Mouth Opening  Dental no notable dental hx.    Pulmonary neg pulmonary ROS,  breath sounds clear to auscultation  Pulmonary exam normal       Cardiovascular Exercise Tolerance: Good hypertension, negative cardio ROS  Rhythm:regular Rate:Normal     Neuro/Psych negative neurological ROS  negative psych ROS   GI/Hepatic negative GI ROS, Neg liver ROS,   Endo/Other  negative endocrine ROS  Renal/GU negative Renal ROS     Musculoskeletal   Abdominal   Peds  Hematology negative hematology ROS (+)   Anesthesia Other Findings MVP (mitral valve prolapse) 01/27/2001 LAST ECHO 07/02/2005 Impression normal LV function, mild MVP with trivial MR, mild tricuspid regurgitation, trivial pulmonic insufficiency.  Hyperlipemia        HTN (hypertension)   blood pressure under control-meds stopped approx 2011    Reproductive/Obstetrics negative OB ROS                          Anesthesia Physical Anesthesia Plan  ASA: II  Anesthesia Plan: General LMA   Post-op Pain Management:    Induction:   Airway Management Planned:   Additional Equipment:   Intra-op Plan:   Post-operative Plan:   Informed Consent: I have reviewed the patients History and Physical, chart, labs and discussed the procedure including the risks, benefits and alternatives for the proposed anesthesia with the patient or authorized representative who has indicated his/her understanding and acceptance.   Dental Advisory Given  Plan Discussed with:  CRNA and Surgeon  Anesthesia Plan Comments:         Anesthesia Quick Evaluation

## 2012-08-03 NOTE — Op Note (Signed)
NAMEHUDSYN, BARICH                 ACCOUNT NO.:  [id number]  MEDICAL RECORD NO.:  [id number]  LOCATION:  [facility]                          FACILITY:  [facility]  PHYSICIAN:  Ivor Costa. Farrel Gobble, M.D. DATE OF BIRTH:  December 12, 1955  DATE OF PROCEDURE:  08/03/2012 DATE OF DISCHARGE:  08/03/2012                              OPERATIVE REPORT   PREOPERATIVE DIAGNOSIS:  Postmenopausal bleeding.  POSTOPERATIVE DIAGNOSIS:  Postmenopausal bleeding.  PROCEDURE:  Hysteroscopy/polypectomy.  SURGEON:  Ivor Costa. Farrel Gobble, MD  ANESTHESIA:  General.  IV FLUIDS:  900 mL.  URINE OUTPUT:  Not measured.  I AND O DEFICIT:  75 mL.  FINDINGS:  Retroverted uterus, large polypoid mass, consuming the cavity and a smaller mass by the tubal ostia on the right.  PATHOLOGY:  Polyps.  DESCRIPTION OF PROCEDURE:  The patient was taken to the operating room. General anesthesia was induced, placed in dorsal lithotomy position. Prepped and draped in usual sterile fashion.  A bimanual exam was performed.  The orientation of the uterus was confirmed.  A sterile weighted speculum was placed into the vagina.  The cervix was stabilized with a single-tooth tenaculum and the paracervical block with 10 mL of 0.25% Marcaine with epinephrine was placed.  The speculum was then changed to bivalve.  The uterus sounded to 8.  The cervix was dilated from Cytotec on the day before.  A diagnostic scope was able to be advanced through the cervix and then to the cavity.  The findings were as noted above.  The scope was switched out to the operative resectoscope.  Poor seeding limited visualization, while the scope was being replaced, the polyp forceps and then a curette was advanced through the cavity.  The curette revealed a single large specimen, consistent with a polyp.  The diagnostic scope was then advanced back through the cervix.  The bases of the polyp were visualized and noted to be hemostatic.  The smaller polyp that was noted by  the ostia was grasped with a polyp forceps in the operative port. The instruments were then removed.  The uterus was noted to be unremarkable as was the cervix.  There was small amount of bleeding on the anterior lip of the cervix that was treated with gentle pressure.  The patient tolerated the procedure well.  Sponge, lap, needle counts were correct x2.  She was given doxycycline intraoperatively and transferred to the PACU in stable condition.     Ivor Costa. Farrel Gobble, M.D.     THL/MEDQ  D:  08/03/2012  T:  08/03/2012  Job:  [id number]

## 2012-08-03 NOTE — Brief Op Note (Signed)
08/03/2012  10:30 AM  PATIENT:  Brooke White  57 y.o. female  PRE-OPERATIVE DIAGNOSIS:  PMB  POST-OPERATIVE DIAGNOSIS:  PMB  PROCEDURE:  Procedure(s): /HYSTEROSCOPY WITH RESECTOCOPE (N/A)  SURGEON:  Surgeon(s) and Role:    * Bennye Alm, MD - Primary  PHYSICIAN ASSISTANT:   ASSISTANTS: none   ANESTHESIA:   general  EBL:  Total I/O In: 900 [I.V.:900] Out: -   BLOOD ADMINISTERED:none  DRAINS: none   LOCAL MEDICATIONS USED:  MARCAINE    and Amount: 10 ml  SPECIMEN:  Source of Specimen:  uterus  DISPOSITION OF SPECIMEN:  PATHOLOGY   COUNTS:  YES  TOURNIQUET:  * No tourniquets in log *  DICTATION: .Other Dictation: Dictation Number [id number]  PLAN OF CARE: Discharge to home after PACU  PATIENT DISPOSITION:  PACU - hemodynamically stable.   Delay start of Pharmacological VTE agent (>24hrs) due to surgical blood loss or risk of bleeding: not applicable

## 2012-08-03 NOTE — Anesthesia Postprocedure Evaluation (Signed)
  Anesthesia Post Note  Patient: Brooke White  Procedure(s) Performed: Procedure(s) (LRB): /HYSTEROSCOPY WITH RESECTOCOPE (N/A)  Anesthesia type: GA  Patient location: PACU  Post pain: Pain level controlled  Post assessment: Post-op Vital signs reviewed  Last Vitals:  Filed Vitals:   08/03/12 0818  BP: 138/93  Pulse: 74  Temp: 36.7 C  Resp: 18    Post vital signs: Reviewed  Level of consciousness: sedated  Complications: No apparent anesthesia complications

## 2012-08-03 NOTE — Transfer of Care (Signed)
Immediate Anesthesia Transfer of Care Note  Patient: Brooke White  Procedure(s) Performed: Procedure(s): /HYSTEROSCOPY WITH RESECTOCOPE (N/A)  Patient Location: PACU  Anesthesia Type:General  Level of Consciousness: awake, alert , oriented and patient cooperative  Airway & Oxygen Therapy: Patient Spontanous Breathing and Patient connected to nasal cannula oxygen  Post-op Assessment: Report given to PACU RN and Post -op Vital signs reviewed and stable  Post vital signs: Reviewed and stable  Complications: No apparent anesthesia complications

## 2012-08-04 ENCOUNTER — Encounter (HOSPITAL_COMMUNITY): Payer: Self-pay | Admitting: Gynecology

## 2012-08-20 ENCOUNTER — Encounter: Payer: Self-pay | Admitting: Gynecology

## 2012-08-20 DIAGNOSIS — N950 Postmenopausal bleeding: Secondary | ICD-10-CM | POA: Insufficient documentation

## 2012-09-23 ENCOUNTER — Other Ambulatory Visit: Payer: Self-pay | Admitting: *Deleted

## 2012-09-23 DIAGNOSIS — E785 Hyperlipidemia, unspecified: Secondary | ICD-10-CM

## 2012-09-23 MED ORDER — ATORVASTATIN CALCIUM 20 MG PO TABS: 20.0000 mg | ORAL_TABLET | Freq: Every day | ORAL | Status: DC

## 2012-09-28 ENCOUNTER — Other Ambulatory Visit: Payer: Self-pay

## 2012-09-28 DIAGNOSIS — Z1231 Encounter for screening mammogram for malignant neoplasm of breast: Secondary | ICD-10-CM

## 2012-10-26 ENCOUNTER — Ambulatory Visit
Admission: RE | Admit: 2012-10-26 | Discharge: 2012-10-26 | Disposition: A | Discharge status: Home / Self Care | Payer: 59 | Source: Ambulatory Visit

## 2012-10-26 DIAGNOSIS — Z1231 Encounter for screening mammogram for malignant neoplasm of breast: Secondary | ICD-10-CM

## 2012-10-27 ENCOUNTER — Other Ambulatory Visit: Payer: Self-pay | Admitting: Gynecology

## 2012-10-27 DIAGNOSIS — R928 Other abnormal and inconclusive findings on diagnostic imaging of breast: Secondary | ICD-10-CM

## 2012-10-28 ENCOUNTER — Other Ambulatory Visit: Payer: Self-pay | Admitting: Gynecology

## 2012-10-30 ENCOUNTER — Encounter: Payer: Self-pay | Admitting: Obstetrics and Gynecology

## 2012-10-30 ENCOUNTER — Telehealth: Payer: Self-pay | Admitting: Gynecology

## 2012-10-30 ENCOUNTER — Ambulatory Visit (INDEPENDENT_AMBULATORY_CARE_PROVIDER_SITE_OTHER): Payer: 59 | Admitting: Obstetrics and Gynecology

## 2012-10-30 VITALS — BP 128/80 | Wt 159.0 lb

## 2012-10-30 DIAGNOSIS — L293 Anogenital pruritus, unspecified: Secondary | ICD-10-CM

## 2012-10-30 DIAGNOSIS — N950 Postmenopausal bleeding: Secondary | ICD-10-CM

## 2012-10-30 DIAGNOSIS — D251 Intramural leiomyoma of uterus: Secondary | ICD-10-CM

## 2012-10-30 DIAGNOSIS — N390 Urinary tract infection, site not specified: Secondary | ICD-10-CM

## 2012-10-30 DIAGNOSIS — N898 Other specified noninflammatory disorders of vagina: Secondary | ICD-10-CM

## 2012-10-30 LAB — POCT URINALYSIS DIPSTICK
Blood, UA: 250
Glucose, UA: NEGATIVE
Urobilinogen, UA: NEGATIVE
pH, UA: 6

## 2012-10-30 MED ORDER — FLUCONAZOLE 150 MG PO TABS: 150.0000 mg | ORAL_TABLET | Freq: Once | ORAL | Status: DC

## 2012-10-30 NOTE — Telephone Encounter (Signed)
PATIENT STATES CONTINUE TO HAVE BLEEDING FROM PROCEDURE DONE LAST MONTH. APPOINTMENT GIVEN FOR DR. Tresa Res TODAY @ 2:00PM

## 2012-10-30 NOTE — Progress Notes (Addendum)
57 yo MWF G5P4 s/p resection of a large endometrial polyp by Dr. Farrel Gobble in Feb 2014 .  Three weeks ago started having vag bleeding after sex and it has never stopped.  Also c/o urinary urgency and vulvar irritation that to her feels like a yeast infection.  Some lower back pain for the last 2 days like she is going to start a period.  The bleeding has never been really heavy, but it is certainly not going away either.  Started Azo probiotics about three weeks ago but that hasn't helped bleeding.    U/A neg except for blood (vaginal bleeding currently)  Exam:  WF in NAD     Neg CVAT     Abd soft, flat, NT, no masses     Pelvic:  Ext appears nl, BUS neg, Vag looks nl, cx nl w/o active bleeding.  Pink vag discharge when vag is wiped with a large swab.  BM:  Uterus is RF, NT, nl size but the fundus feels firm and heavy c/w fibroid.  Review of previous records reveals that in Dec 2013 she had a PUS which showed two fibroids, one 3 cm and the other 1.6 cm with a uterine size of 9 x 6 x 6 cm.  (G5P4)  At that time she also had the large endometrial polyp which was later resected by Dr. Farrel Gobble.  A:  Post meno bleeding, recurrent after hysteroscopic resection of polyps      Fundal fibroid, probably the cause of this bleeding      prob yeast vaginitis by sx's  P:  Discussed situation with patient.  Clearly she has no cancer causing the bleeding so I reassured her about that.  THe bleeding is likely from the fibroid which at age almost 71 if it is causing bleeding now it will probably continue to do so.  Options would be either patience and conservative management, or hysterectomy.  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  She will discuss with her husband and let me know if she wants to proceed.    RX:  Diflucan 150 mg x1 for presumed yeast vaginitis.

## 2012-10-30 NOTE — Telephone Encounter (Signed)
Pt has been bleeding for 3weeks. Please call to schedule an appt. She is available this afternoon after 2  Or Thursday afternoon after 1 or 2pm.

## 2012-10-30 NOTE — Patient Instructions (Signed)
Let me know if you wish to proceed with surgery.

## 2012-11-02 ENCOUNTER — Encounter: Payer: Self-pay | Admitting: Cardiovascular Disease

## 2012-11-04 ENCOUNTER — Telehealth: Payer: Self-pay | Admitting: Obstetrics and Gynecology

## 2012-11-04 NOTE — Telephone Encounter (Signed)
Patient made it clear that she was specifically returning a phone call to Kennon Rounds in regards to making an appointment.

## 2012-11-04 NOTE — Telephone Encounter (Signed)
She returned Sally's phone call about scheduling her hysterectomy.

## 2012-11-04 NOTE — Telephone Encounter (Signed)
Female states she is not available, LM i was returning her call and can call back tomm after 9am

## 2012-11-05 ENCOUNTER — Ambulatory Visit
Admission: RE | Admit: 2012-11-05 | Discharge: 2012-11-05 | Disposition: A | Discharge status: Home / Self Care | Payer: 59 | Source: Ambulatory Visit | Attending: Gynecology | Admitting: Gynecology

## 2012-11-05 DIAGNOSIS — R928 Other abnormal and inconclusive findings on diagnostic imaging of breast: Secondary | ICD-10-CM

## 2012-11-05 NOTE — Telephone Encounter (Signed)
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  She states she does not care if Dr Tresa Res or Dr Farrel Gobble does case, date is more important to her and Dr Farrel Gobble did her initial surgery. She saw Dr Tresa Res due to her concern about bleeding. Ok to proceed with scheduling and what type of hysterectomy?

## 2012-11-05 NOTE — Telephone Encounter (Signed)
Yes, fine to schedule the surgery with Dr. Farrel Gobble.  Ask her what type of hysterectomy.

## 2012-11-06 NOTE — Telephone Encounter (Signed)
Robotic hyst with tubes

## 2012-11-19 ENCOUNTER — Encounter: Payer: Self-pay | Admitting: Gynecology

## 2012-11-20 ENCOUNTER — Telehealth: Payer: Self-pay | Admitting: Gynecology

## 2012-11-20 NOTE — Telephone Encounter (Signed)
LM that I am still working on scheduling surgery.  Date patient requested is not available at hospital (12-07-12).  Currently scheduled for 12-29-12, please call me on Monday to discuss.

## 2012-11-20 NOTE — Telephone Encounter (Signed)
Patient is calling regarding her appointment her surgery (hyst)

## 2012-11-23 NOTE — Telephone Encounter (Signed)
Patient left message on my VM to call her back at home. Call to patient and left message again that requested date was not available and currently scheduled for 7--15-14.  Need to discuss with her f this will workl. She would be second case that day.LMTCB.

## 2012-11-23 NOTE — Telephone Encounter (Signed)
See next telephone note for scheduling info.

## 2012-11-24 NOTE — Telephone Encounter (Signed)
Patient returning Sally's call. °

## 2012-11-25 ENCOUNTER — Telehealth: Payer: Self-pay | Admitting: *Deleted

## 2012-11-25 NOTE — Telephone Encounter (Signed)
Surgery date of 12-29-12 discussed with patient and she is aggreable.  Surgery instructions reviewed and mailed to patient. Pre/post op appts scheduled.

## 2012-11-25 NOTE — Telephone Encounter (Signed)
See next telephone note ?

## 2012-12-02 ENCOUNTER — Telehealth: Payer: Self-pay | Admitting: *Deleted

## 2012-12-09 ENCOUNTER — Encounter: Payer: Self-pay | Admitting: Cardiovascular Disease

## 2012-12-09 ENCOUNTER — Ambulatory Visit (INDEPENDENT_AMBULATORY_CARE_PROVIDER_SITE_OTHER): Payer: 59 | Admitting: Cardiovascular Disease

## 2012-12-09 ENCOUNTER — Telehealth: Payer: Self-pay | Admitting: Gynecology

## 2012-12-09 VITALS — BP 130/100 | HR 70 | Ht 64.0 in | Wt 158.0 lb

## 2012-12-09 DIAGNOSIS — E785 Hyperlipidemia, unspecified: Secondary | ICD-10-CM

## 2012-12-09 DIAGNOSIS — I059 Rheumatic mitral valve disease, unspecified: Secondary | ICD-10-CM

## 2012-12-09 DIAGNOSIS — I341 Nonrheumatic mitral (valve) prolapse: Secondary | ICD-10-CM

## 2012-12-09 DIAGNOSIS — I10 Essential (primary) hypertension: Secondary | ICD-10-CM

## 2012-12-09 LAB — BASIC METABOLIC PANEL
BUN: 17 mg/dL (ref 6–23)
Chloride: 104 mEq/L (ref 96–112)
Creatinine, Ser: 0.8 mg/dL (ref 0.4–1.2)
GFR: 74.81 mL/min (ref >60.00)
Glucose, Bld: 113 mg/dL — ABNORMAL HIGH (ref 70–99)
Potassium: 4.7 mEq/L (ref 3.5–5.1)

## 2012-12-09 LAB — LIPID PANEL
LDL Cholesterol: 91 mg/dL (ref 0–99)
VLDL: 23.6 mg/dL (ref 0.0–40.0)

## 2012-12-09 LAB — HEPATIC FUNCTION PANEL
Alkaline Phosphatase: 74 U/L (ref 39–117)
Bilirubin, Direct: 0.1 mg/dL (ref 0.0–0.3)
Total Bilirubin: 0.8 mg/dL (ref 0.3–1.2)

## 2012-12-09 NOTE — Telephone Encounter (Signed)
LMTCB to discuss insurance benefits for upcoming surgery.

## 2012-12-09 NOTE — Assessment & Plan Note (Signed)
We'll check her lipids today.

## 2012-12-09 NOTE — Assessment & Plan Note (Signed)
Her mitral valve prolapse is stable. We'll continue to follow her on a yearly basis.

## 2012-12-09 NOTE — Progress Notes (Signed)
Brooke White Date of Birth  September 11, 1955 [facility]     [place] [facility]  [address]    [address]   [address] [place], [place]  [address]    [place], [place]  [address] [phone]  Fax  [phone]  [phone]  Fax [phone]  Problem List: 1. Mitral Valve prolapse 2. Hyperlipidemia   History of Present Illness:  Brooke White is a 57 year old female with a history of mitral valve prolapse, hyperlipidemia, and hypertension. She's done very well since I last saw her a year and half ago. She has not had any episodes of chest pain or shortness of breath. She's been able to do all of her normal activities without any significant problems.  December 09, 2012:  No new cardiac complaints.  She is having a hysterectomy.  She has uterine polyps and is having lots of bleeding.   She has a grandson as of last October.  She is not exercising much recently.    Current Outpatient Prescriptions on File Prior to Visit  Medication Sig Dispense Refill  . atorvastatin (LIPITOR) 20 MG tablet Take 1 tablet (20 mg total) by mouth daily.  30 tablet  3  . calcium carbonate (TUMS - DOSED IN MG ELEMENTAL CALCIUM) 500 MG chewable tablet Chew 1 tablet by mouth daily.      . cetirizine (ZYRTEC) 10 MG tablet Take 10 mg by mouth daily as needed. For allergies      . Multiple Vitamins-Minerals (MULTIVITAMIN WITH MINERALS) tablet Take 1 tablet by mouth daily.      . Phenazopyridine HCl (AZO-STANDARD PO) Take by mouth.       No current facility-administered medications on file prior to visit.    No Known Allergies  Past Medical History  Diagnosis Date  . MVP (mitral valve prolapse) 01/27/2001    LAST ECHO 07/02/2005 Impression normal LV function, mild MVP with trivial MR, mild tricuspid regurgitation, trivial pulmonic insufficiency.   . Hyperlipemia   . HTN (hypertension)     blood pressure under control-meds stopped approx 2011  . Post-menopausal bleeding 2014    endometrial polyp  .  Elevated cholesterol     Past Surgical History  Procedure Laterality Date  . Dilation and curettage of uterus  2005  . Bunionectomy  2004  . Tubal ligation  2002  . Dilatation & currettage/hysteroscopy with resectocope N/A 08/03/2012    Procedure: Melton Krebs WITH RESECTOCOPE;  Surgeon: Bennye Alm, MD;  Location: [facility];  Service: Gynecology;  Laterality: N/A;  . Hysteroscopy  07/2012    2007   . Root canal    . Vein surgery Bilateral     History  Smoking status  . Never Smoker   Smokeless tobacco  . Never Used    History  Alcohol Use No    Family History  Problem Relation Age of Onset  . Hypertension    . Heart disease      Reviw of Systems:  Reviewed in the HPI.  All other systems are negative.  Physical Exam: Blood pressure 130/100, pulse 70, height 5\' 4"  (1.626 m), weight 158 lb (71.668 kg), last menstrual period 03/15/2012. General: Well developed, well nourished, in no acute distress.  Head: Normocephalic, atraumatic, sclera non-icteric, mucus membranes are moist,   Neck: Supple. Carotids are 2 + without bruits. No JVD  Lungs: Clear bilaterally to auscultation.  Heart: regular rate.  normal  S1 S2. There is a very soft systolic murmur at the left  sternal border.  Abdomen: Soft, non-tender, non-distended with normal bowel sounds. No hepatomegaly. No rebound/guarding. No masses.  Msk:  Strength and tone are normal  Extremities: No clubbing or cyanosis. No edema.  Distal pedal pulses are 2+ and equal bilaterally.  Neuro: Alert and oriented X 3. Moves all extremities spontaneously.  Psych:  Responds to questions appropriately with a normal affect.  ECG: December 09, 2012:    Normal sinus rhythm at 66 beats a minute. She has poor R-wave progression which is likely due to lead placement.  Assessment / Plan:

## 2012-12-09 NOTE — Patient Instructions (Addendum)
Your physician wants you to follow-up in: 1 YEAR  You will receive a reminder letter in the mail two months in advance. If you don't receive a letter, please call our office to schedule the follow-up appointment.  Your physician recommends that you return for lab work in: TODAY    .REDUCE HIGH SODIUM FOODS LIKE CANNED SOUP, GRAVY, SAUCES, READY PREPARED FOODS LIKE FROZEN FOODS; LEAN CUISINE, LASAGNA. BACON, SAUSAGE, LUNCH MEAT, FAST FOODS..HOTDOGS AND CHIPS  DASH Diet The DASH diet stands for "Dietary Approaches to Stop Hypertension." It is a healthy eating plan that has been shown to reduce high blood pressure (hypertension) in as little as 14 days, while also possibly providing other significant health benefits. These other health benefits include reducing the risk of breast cancer after menopause and reducing the risk of type 2 diabetes, heart disease, colon cancer, and stroke. Health benefits also include weight loss and slowing kidney failure in patients with chronic kidney disease.  DIET GUIDELINES  Limit salt (sodium). Your diet should contain less than 1500 mg of sodium daily.  Limit refined or processed carbohydrates. Your diet should include mostly whole grains. Desserts and added sugars should be used sparingly.  Include small amounts of heart-healthy fats. These types of fats include nuts, oils, and tub margarine. Limit saturated and trans fats. These fats have been shown to be harmful in the body. CHOOSING FOODS  The following food groups are based on a 2000 calorie diet. See your Registered Dietitian for individual calorie needs. Grains and Grain Products (6 to 8 servings daily)  Eat More Often: Whole-wheat bread, brown rice, whole-grain or wheat pasta, quinoa, popcorn without added fat or salt (air popped).  Eat Less Often: White bread, white pasta, white rice, cornbread. Vegetables (4 to 5 servings daily)  Eat More Often: Fresh, frozen, and canned vegetables. Vegetables may  be raw, steamed, roasted, or grilled with a minimal amount of fat.  Eat Less Often/Avoid: Creamed or fried vegetables. Vegetables in a cheese sauce. Fruit (4 to 5 servings daily)  Eat More Often: All fresh, canned (in natural juice), or frozen fruits. Dried fruits without added sugar. One hundred percent fruit juice ( cup [237 mL] daily).  Eat Less Often: Dried fruits with added sugar. Canned fruit in light or heavy syrup. [employer], Fish, and Poultry (2 servings or less daily. One serving is 3 to 4 oz [85-114 g]).  Eat More Often: Ninety percent or leaner ground beef, tenderloin, sirloin. Round cuts of beef, chicken breast, [place] breast. All fish. Grill, bake, or broil your meat. Nothing should be fried.  Eat Less Often/Avoid: Fatty cuts of meat, [place], or chicken leg, thigh, or wing. Fried cuts of meat or fish. Dairy (2 to 3 servings)  Eat More Often: Low-fat or fat-free milk, low-fat plain or light yogurt, reduced-fat or part-skim cheese.  Eat Less Often/Avoid: Milk (whole, 2%).Whole milk yogurt. Full-fat cheeses. Nuts, Seeds, and Legumes (4 to 5 servings per week)  Eat More Often: All without added salt.  Eat Less Often/Avoid: Salted nuts and seeds, canned beans with added salt. Fats and Sweets (limited)  Eat More Often: Vegetable oils, tub margarines without trans fats, sugar-free gelatin. Mayonnaise and salad dressings.  Eat Less Often/Avoid: Coconut oils, palm oils, butter, stick margarine, cream, half and half, cookies, candy, pie. FOR MORE INFORMATION The Dash Diet Eating Plan: www.dashdiet.org Document Released: 05/23/2011 Document Revised: 08/26/2011 Document Reviewed: 05/23/2011 [facility] Patient Information 2014 [place], [place].

## 2012-12-11 ENCOUNTER — Ambulatory Visit (INDEPENDENT_AMBULATORY_CARE_PROVIDER_SITE_OTHER): Payer: 59 | Admitting: Gynecology

## 2012-12-11 VITALS — BP 128/78 | HR 70 | Resp 14 | Ht 64.0 in | Wt 166.0 lb

## 2012-12-11 DIAGNOSIS — D251 Intramural leiomyoma of uterus: Secondary | ICD-10-CM

## 2012-12-11 DIAGNOSIS — Z01818 Encounter for other preprocedural examination: Secondary | ICD-10-CM

## 2012-12-11 DIAGNOSIS — N950 Postmenopausal bleeding: Secondary | ICD-10-CM

## 2012-12-11 MED ORDER — HYDROMORPHONE HCL 2 MG PO TABS: 2.0000 mg | ORAL_TABLET | ORAL | Status: DC | PRN

## 2012-12-11 MED ORDER — CELECOXIB 200 MG PO CAPS: 200.0000 mg | ORAL_CAPSULE | Freq: Two times a day (BID) | ORAL | Status: DC

## 2012-12-11 NOTE — Patient Instructions (Signed)
Simethicone twice a day ducolax  Stay on stool softener for 3d after last narcotic miralax non-gas producing Clears day before OR-jello, tea, broth celebrex 2 tab morning of OR with sips, then 1 pill twice a day for 2d

## 2012-12-11 NOTE — Progress Notes (Signed)
57 y.o.MarriedNot Hispanic or Latinofemale presents for preoperative consult for robotic hysterectomy with bilateral salpingectomy for abnormal uterine bleeding and fibroids  .  Pertinent items are noted in HPI. BP 128/78  Pulse 70  Resp 14  Ht 5\' 4"  (1.626 m)  Wt 166 lb (75.297 kg)  BMI 28.48 kg/m2  LMP 03/15/2012 General appearance: alert, cooperative and appears stated age Lungs: clear to auscultation bilaterally Heart: regular rate and rhythm, S1, S2 normal, no murmur, click, rub or gallop Abdomen: soft, non-tender; bowel sounds normal; no masses,  no organomegaly Pelvic: cervix normal in appearance, external genitalia normal, no adnexal masses or tenderness, no cervical motion tenderness, uterus normal size, shape, and consistency and vagina normal without discharge Extremities: extremities normal, atraumatic, no cyanosis or edema Skin: Skin color, texture, turgor normal. No rashes or lesions Lymph nodes: negative   The procedure was discussed at length, including the expected post-operative course. The ACOG handoutwas not given to the patientprior to the consult.  Pt informed regarding risks and benefits of surgery, including but not limited to bleeding, infections, damage to bowel, bladder and major vessels either during port placement or during the surgical procedure.  Patient was made aware that damage that is seen immediately would be addressed at the time and may require conversion to an open procedure and additional surgical consults.  In addition some damage is delayed and may require return to the operating room in the future.    Risk of blood transfusion from discussed, including the risk of infection from receiving blood was discussed.  It was recommended that the patient does not donate autologously.  The patientwill be accept blood products if deemed necessary.  There is a risk of formation of a deep vein thrombus in the extremities was discussed, although PAS will be  placed to minimize the risk, the patient was informed that a pulmonary embolism could still form and could result in death.  Early ambulation was stressed.  She was instructed on signs and symptoms to be aware of and the need to call if they should develop. In addition the risk of bowel herniation was discussed and signs to be aware of reviewed  All questions were addressed.  Post-operative medications dilaudid, celebrex were given to the patient. She was instructed for clears the day before OR

## 2012-12-14 ENCOUNTER — Encounter (HOSPITAL_COMMUNITY): Payer: Self-pay

## 2012-12-14 NOTE — Telephone Encounter (Signed)
Patient was given a list of medications, patient is not sure when to take these meds (before or after surgery).

## 2012-12-15 NOTE — Telephone Encounter (Signed)
Patient had questions concerning OTC meds she purchased per advise of Dr. Farrel Gobble. Instructions given for Simethicone and Miralax to start after surgery at home and stool softeners to start after surgery at home. patient understands.

## 2012-12-28 ENCOUNTER — Encounter (HOSPITAL_COMMUNITY): Payer: Self-pay

## 2012-12-28 ENCOUNTER — Encounter (HOSPITAL_COMMUNITY)
Admission: RE | Admit: 2012-12-28 | Discharge: 2012-12-28 | Disposition: A | Discharge status: Home / Self Care | Payer: 59 | Source: Ambulatory Visit | Attending: Gynecology | Admitting: Gynecology

## 2012-12-28 LAB — CBC
Hemoglobin: 14.7 g/dL (ref 12.0–15.0)
MCH: 28.9 pg (ref 26.0–34.0)
MCV: 86.1 fL (ref 78.0–100.0)
RBC: 5.09 MIL/uL (ref 3.87–5.11)

## 2012-12-28 NOTE — Patient Instructions (Addendum)
22 Brooke White  12/28/2012   Your procedure is scheduled on:  12/29/12  Enter through the Main Entrance of [facility] at 1200 Noon  Pick up the phone at the desk and dial [phone].   Call this number if you have problems the morning of surgery: [phone]   Remember:   Do not eat food:After Midnight.  Do not drink clear liquids: 6 Hours before arrival.  Take these medicines the morning of surgery with A SIP OF WATER: NA   Do not wear jewelry, make-up or nail polish.  Do not wear lotions, powders, or perfumes. You may wear deodorant.  Do not shave 48 hours prior to surgery.  Do not bring valuables to the hospital.  [facility] is not responsible                  for any belongings or valuables brought to the hospital.  Contacts, dentures or bridgework may not be worn into surgery.  Leave suitcase in the car. After surgery it may be brought to your room.  For patients admitted to the hospital, checkout time is 11:00 AM the day of                discharge.   Patients discharged the day of surgery will not be allowed to drive                   home.  Name and phone number of your driver: NA  Special Instructions: Shower using CHG 2 nights before surgery and the night before surgery.  If you shower the day of surgery use CHG.  Use special wash - you have one bottle of CHG for all showers.  You should use approximately 1/3 of the bottle for each shower.   Please read over the following fact sheets that you were given: Surgical Site Infection Prevention

## 2012-12-29 ENCOUNTER — Ambulatory Visit (HOSPITAL_COMMUNITY)
Admission: RE | Admit: 2012-12-29 | Discharge: 2012-12-30 | Disposition: A | Discharge status: Home / Self Care | Payer: 59 | Source: Ambulatory Visit | Attending: Gynecology | Admitting: Gynecology

## 2012-12-29 ENCOUNTER — Encounter (HOSPITAL_COMMUNITY): Payer: Self-pay | Admitting: Anesthesiology

## 2012-12-29 ENCOUNTER — Ambulatory Visit (HOSPITAL_COMMUNITY): Payer: 59 | Admitting: Anesthesiology

## 2012-12-29 ENCOUNTER — Encounter (HOSPITAL_COMMUNITY)
Admission: RE | Disposition: A | Discharge status: Home / Self Care | Payer: Self-pay | Source: Ambulatory Visit | Attending: Gynecology

## 2012-12-29 DIAGNOSIS — Z9889 Other specified postprocedural states: Secondary | ICD-10-CM

## 2012-12-29 DIAGNOSIS — D251 Intramural leiomyoma of uterus: Secondary | ICD-10-CM

## 2012-12-29 DIAGNOSIS — N950 Postmenopausal bleeding: Secondary | ICD-10-CM

## 2012-12-29 DIAGNOSIS — N938 Other specified abnormal uterine and vaginal bleeding: Secondary | ICD-10-CM | POA: Insufficient documentation

## 2012-12-29 DIAGNOSIS — N949 Unspecified condition associated with female genital organs and menstrual cycle: Secondary | ICD-10-CM | POA: Insufficient documentation

## 2012-12-29 HISTORY — PX: CYSTOSCOPY: SHX5120

## 2012-12-29 HISTORY — PX: BILATERAL SALPINGECTOMY: SHX5743

## 2012-12-29 HISTORY — PX: ROBOTIC ASSISTED TOTAL HYSTERECTOMY: SHX6085

## 2012-12-29 LAB — TYPE AND SCREEN: Antibody Screen: NEGATIVE

## 2012-12-29 LAB — ABO/RH: ABO/RH(D): A NEG

## 2012-12-29 SURGERY — ROBOTIC ASSISTED TOTAL HYSTERECTOMY
Anesthesia: General | Site: Abdomen | Wound class: Clean Contaminated

## 2012-12-29 MED ORDER — LIDOCAINE HCL (CARDIAC) 20 MG/ML IV SOLN
INTRAVENOUS | Status: AC
  Filled 2012-12-29: qty 5

## 2012-12-29 MED ORDER — GLYCOPYRROLATE 0.2 MG/ML IJ SOLN
INTRAMUSCULAR | Status: AC
  Filled 2012-12-29: qty 3

## 2012-12-29 MED ORDER — HYDROMORPHONE HCL PF 1 MG/ML IJ SOLN
0.2000 mg | INTRAMUSCULAR | Status: DC | PRN
  Administered 2012-12-29: 0.6 mg via INTRAVENOUS
  Filled 2012-12-29: qty 1

## 2012-12-29 MED ORDER — MIDAZOLAM HCL 2 MG/2ML IJ SOLN
INTRAMUSCULAR | Status: AC
  Filled 2012-12-29: qty 2

## 2012-12-29 MED ORDER — DEXAMETHASONE SODIUM PHOSPHATE 10 MG/ML IJ SOLN
INTRAMUSCULAR | Status: AC
  Filled 2012-12-29: qty 1

## 2012-12-29 MED ORDER — GLYCOPYRROLATE 0.2 MG/ML IJ SOLN
INTRAMUSCULAR | Status: AC
  Filled 2012-12-29: qty 4

## 2012-12-29 MED ORDER — PROPOFOL 10 MG/ML IV EMUL
INTRAVENOUS | Status: AC
  Filled 2012-12-29: qty 20

## 2012-12-29 MED ORDER — ONDANSETRON HCL 4 MG/2ML IJ SOLN: 4.0000 mg | Freq: Four times a day (QID) | INTRAMUSCULAR | Status: DC | PRN

## 2012-12-29 MED ORDER — LIDOCAINE HCL (CARDIAC) 20 MG/ML IV SOLN
INTRAVENOUS | Status: DC | PRN
  Administered 2012-12-29: 50 mg via INTRAVENOUS

## 2012-12-29 MED ORDER — HYDROMORPHONE HCL PF 1 MG/ML IJ SOLN
INTRAMUSCULAR | Status: AC
  Filled 2012-12-29: qty 1

## 2012-12-29 MED ORDER — MENTHOL 3 MG MT LOZG: 1.0000 | LOZENGE | OROMUCOSAL | Status: DC | PRN

## 2012-12-29 MED ORDER — FENTANYL CITRATE 0.05 MG/ML IJ SOLN
INTRAMUSCULAR | Status: AC
  Filled 2012-12-29: qty 5

## 2012-12-29 MED ORDER — PROPOFOL 10 MG/ML IV BOLUS
INTRAVENOUS | Status: DC | PRN
  Administered 2012-12-29: 150 mg via INTRAVENOUS
  Administered 2012-12-29: 50 mg via INTRAVENOUS

## 2012-12-29 MED ORDER — FENTANYL CITRATE 0.05 MG/ML IJ SOLN
INTRAMUSCULAR | Status: AC
  Filled 2012-12-29: qty 2

## 2012-12-29 MED ORDER — ACETAMINOPHEN 10 MG/ML IV SOLN
INTRAVENOUS | Status: AC
  Filled 2012-12-29: qty 100

## 2012-12-29 MED ORDER — FENTANYL CITRATE 0.05 MG/ML IJ SOLN
INTRAMUSCULAR | Status: DC | PRN
  Administered 2012-12-29: 50 ug via INTRAVENOUS
  Administered 2012-12-29: 100 ug via INTRAVENOUS
  Administered 2012-12-29: 50 ug via INTRAVENOUS
  Administered 2012-12-29: 150 ug via INTRAVENOUS

## 2012-12-29 MED ORDER — LACTATED RINGERS IV SOLN
INTRAVENOUS | Status: DC
  Administered 2012-12-29 (×3): via INTRAVENOUS

## 2012-12-29 MED ORDER — ROPIVACAINE HCL 5 MG/ML IJ SOLN
INTRAMUSCULAR | Status: DC | PRN
  Administered 2012-12-29: 70 mL

## 2012-12-29 MED ORDER — DEXAMETHASONE SODIUM PHOSPHATE 10 MG/ML IJ SOLN
INTRAMUSCULAR | Status: DC | PRN
  Administered 2012-12-29: 10 mg via INTRAVENOUS

## 2012-12-29 MED ORDER — ROCURONIUM BROMIDE 100 MG/10ML IV SOLN
INTRAVENOUS | Status: DC | PRN
  Administered 2012-12-29: 10 mg via INTRAVENOUS
  Administered 2012-12-29: 50 mg via INTRAVENOUS
  Administered 2012-12-29: 10 mg via INTRAVENOUS
  Administered 2012-12-29: 20 mg via INTRAVENOUS

## 2012-12-29 MED ORDER — ONDANSETRON HCL 4 MG PO TABS: 4.0000 mg | ORAL_TABLET | Freq: Four times a day (QID) | ORAL | Status: DC | PRN

## 2012-12-29 MED ORDER — DOCUSATE SODIUM 100 MG PO CAPS
100.0000 mg | ORAL_CAPSULE | Freq: Two times a day (BID) | ORAL | Status: DC
  Administered 2012-12-29 – 2012-12-30 (×2): 100 mg via ORAL
  Filled 2012-12-29 (×2): qty 1

## 2012-12-29 MED ORDER — INDIGOTINDISULFONATE SODIUM 8 MG/ML IJ SOLN
INTRAMUSCULAR | Status: DC | PRN
  Administered 2012-12-29: 3 mL via INTRAVENOUS

## 2012-12-29 MED ORDER — DEXTROSE 5 % IV SOLN
2.0000 g | INTRAVENOUS | Status: DC
  Filled 2012-12-29: qty 2

## 2012-12-29 MED ORDER — TRAMADOL HCL 50 MG PO TABS
50.0000 mg | ORAL_TABLET | Freq: Four times a day (QID) | ORAL | Status: DC | PRN
  Administered 2012-12-30 (×2): 50 mg via ORAL
  Filled 2012-12-29 (×2): qty 1

## 2012-12-29 MED ORDER — NEOSTIGMINE METHYLSULFATE 1 MG/ML IJ SOLN
INTRAMUSCULAR | Status: AC
  Filled 2012-12-29: qty 1

## 2012-12-29 MED ORDER — LACTATED RINGERS IR SOLN
Status: DC | PRN
  Administered 2012-12-29: 3000 mL

## 2012-12-29 MED ORDER — ONDANSETRON HCL 4 MG/2ML IJ SOLN
INTRAMUSCULAR | Status: AC
Start: 2012-12-29 — End: 2012-12-29
  Filled 2012-12-29: qty 2

## 2012-12-29 MED ORDER — DEXTROSE 5 % IV SOLN
2.0000 g | INTRAVENOUS | Status: DC | PRN
  Administered 2012-12-29: 2 g via INTRAVENOUS

## 2012-12-29 MED ORDER — ROPIVACAINE HCL 5 MG/ML IJ SOLN
INTRAMUSCULAR | Status: AC
  Filled 2012-12-29: qty 60

## 2012-12-29 MED ORDER — ROCURONIUM BROMIDE 50 MG/5ML IV SOLN
INTRAVENOUS | Status: AC
  Filled 2012-12-29: qty 1

## 2012-12-29 MED ORDER — HYDROMORPHONE HCL PF 1 MG/ML IJ SOLN
INTRAMUSCULAR | Status: DC | PRN
  Administered 2012-12-29: 1 mg via INTRAVENOUS

## 2012-12-29 MED ORDER — KETOROLAC TROMETHAMINE 30 MG/ML IJ SOLN
INTRAMUSCULAR | Status: AC
Start: 2012-12-29 — End: 2012-12-29
  Filled 2012-12-29: qty 1

## 2012-12-29 MED ORDER — MIDAZOLAM HCL 5 MG/5ML IJ SOLN
INTRAMUSCULAR | Status: DC | PRN
  Administered 2012-12-29: 2 mg via INTRAVENOUS

## 2012-12-29 MED ORDER — INDIGOTINDISULFONATE SODIUM 8 MG/ML IJ SOLN
INTRAMUSCULAR | Status: AC
  Filled 2012-12-29: qty 5

## 2012-12-29 MED ORDER — ONDANSETRON HCL 4 MG/2ML IJ SOLN
INTRAMUSCULAR | Status: AC
  Filled 2012-12-29: qty 2

## 2012-12-29 MED ORDER — ARTIFICIAL TEARS OP OINT
TOPICAL_OINTMENT | OPHTHALMIC | Status: AC
  Filled 2012-12-29: qty 3.5

## 2012-12-29 MED ORDER — ONDANSETRON HCL 4 MG/2ML IJ SOLN
INTRAMUSCULAR | Status: DC | PRN
  Administered 2012-12-29: 4 mg via INTRAVENOUS

## 2012-12-29 MED ORDER — CELECOXIB 100 MG PO CAPS
100.0000 mg | ORAL_CAPSULE | Freq: Two times a day (BID) | ORAL | Status: DC
Start: 2012-12-30 — End: 2012-12-30
  Administered 2012-12-30: 100 mg via ORAL
  Filled 2012-12-29: qty 1

## 2012-12-29 MED ORDER — GLYCOPYRROLATE 0.2 MG/ML IJ SOLN
INTRAMUSCULAR | Status: DC | PRN
  Administered 2012-12-29: .9 mg via INTRAVENOUS
  Administered 2012-12-29: 0.2 mg via INTRAVENOUS

## 2012-12-29 MED ORDER — SIMETHICONE 80 MG PO CHEW
160.0000 mg | CHEWABLE_TABLET | Freq: Four times a day (QID) | ORAL | Status: DC
  Administered 2012-12-29: 80 mg via ORAL
  Administered 2012-12-30: 160 mg via ORAL

## 2012-12-29 MED ORDER — LACTATED RINGERS IV SOLN
INTRAVENOUS | Status: DC
  Administered 2012-12-29: 22:00:00 via INTRAVENOUS

## 2012-12-29 MED ORDER — NEOSTIGMINE METHYLSULFATE 1 MG/ML IJ SOLN
INTRAMUSCULAR | Status: DC | PRN
  Administered 2012-12-29: 5 mg via INTRAVENOUS

## 2012-12-29 MED ORDER — ACETAMINOPHEN 10 MG/ML IV SOLN
1000.0000 mg | Freq: Once | INTRAVENOUS | Status: AC
  Administered 2012-12-29: 1000 mg via INTRAVENOUS

## 2012-12-29 SURGICAL SUPPLY — 71 items
BAG URINE DRAINAGE (UROLOGICAL SUPPLIES) ×4 IMPLANT
BARRIER ADHS 3X4 INTERCEED (GAUZE/BANDAGES/DRESSINGS) ×4 IMPLANT
BENZOIN TINCTURE PRP APPL 2/3 (GAUZE/BANDAGES/DRESSINGS) IMPLANT
CHLORAPREP W/TINT 26ML (MISCELLANEOUS) ×4 IMPLANT
CONT PATH 16OZ SNAP LID 3702 (MISCELLANEOUS) ×4 IMPLANT
COVER MAYO STAND STRL (DRAPES) ×4 IMPLANT
COVER TABLE BACK 60X90 (DRAPES) ×8 IMPLANT
COVER TIP SHEARS 8 DVNC (MISCELLANEOUS) ×3 IMPLANT
COVER TIP SHEARS 8MM DA VINCI (MISCELLANEOUS) ×1
DECANTER SPIKE VIAL GLASS SM (MISCELLANEOUS) ×16 IMPLANT
DRAPE HUG U DISPOSABLE (DRAPE) ×4 IMPLANT
DRAPE LG THREE QUARTER DISP (DRAPES) ×8 IMPLANT
DRAPE WARM FLUID 44X44 (DRAPE) ×4 IMPLANT
ELECT REM PT RETURN 9FT ADLT (ELECTROSURGICAL) ×4
ELECTRODE REM PT RTRN 9FT ADLT (ELECTROSURGICAL) ×3 IMPLANT
EVACUATOR SMOKE 8.L (FILTER) ×4 IMPLANT
GAUZE VASELINE 3X9 (GAUZE/BANDAGES/DRESSINGS) IMPLANT
GLOVE BIO SURGEON STRL SZ 6.5 (GLOVE) ×12 IMPLANT
GLOVE BIOGEL M 6.5 STRL (GLOVE) ×16 IMPLANT
GLOVE BIOGEL PI IND STRL 6.5 (GLOVE) ×6 IMPLANT
GLOVE BIOGEL PI IND STRL 7.0 (GLOVE) ×9 IMPLANT
GLOVE BIOGEL PI INDICATOR 6.5 (GLOVE) ×2
GLOVE BIOGEL PI INDICATOR 7.0 (GLOVE) ×3
GOWN STRL REIN XL XLG (GOWN DISPOSABLE) ×28 IMPLANT
GYRUS RUMI II 2.5CM BLUE (DISPOSABLE)
GYRUS RUMI II 3.5CM BLUE (DISPOSABLE)
GYRUS RUMI II 4.0CM BLUE (DISPOSABLE)
KIT ACCESSORY DA VINCI DISP (KITS) ×1
KIT ACCESSORY DVNC DISP (KITS) ×3 IMPLANT
LEGGING LITHOTOMY PAIR STRL (DRAPES) ×4 IMPLANT
NEEDLE INSUFFLATION 120MM (ENDOMECHANICALS) ×4 IMPLANT
OCCLUDER COLPOPNEUMO (BALLOONS) ×4 IMPLANT
PACK LAVH (CUSTOM PROCEDURE TRAY) ×4 IMPLANT
PAD PREP 24X48 CUFFED NSTRL (MISCELLANEOUS) ×8 IMPLANT
PLUG CATH AND CAP STER (CATHETERS) ×4 IMPLANT
PROTECTOR NERVE ULNAR (MISCELLANEOUS) ×8 IMPLANT
RUMI II 3.0CM BLUE KOH-EFFICIE (DISPOSABLE) IMPLANT
RUMI II GYRUS 2.5CM BLUE (DISPOSABLE) IMPLANT
RUMI II GYRUS 3.5CM BLUE (DISPOSABLE) IMPLANT
RUMI II GYRUS 4.0CM BLUE (DISPOSABLE) IMPLANT
SET CYSTO W/LG BORE CLAMP LF (SET/KITS/TRAYS/PACK) ×4 IMPLANT
SET IRRIG TUBING LAPAROSCOPIC (IRRIGATION / IRRIGATOR) ×4 IMPLANT
SOLUTION ELECTROLUBE (MISCELLANEOUS) ×4 IMPLANT
STRIP CLOSURE SKIN 1/4X4 (GAUZE/BANDAGES/DRESSINGS) IMPLANT
SUT VIC AB 0 CT1 27 (SUTURE) ×1
SUT VIC AB 0 CT1 27XBRD ANBCTR (SUTURE) ×3 IMPLANT
SUT VIC AB 0 CT1 27XBRD ANTBC (SUTURE) IMPLANT
SUT VICRYL 0 UR6 27IN ABS (SUTURE) ×4 IMPLANT
SUT VICRYL RAPIDE 4/0 PS 2 (SUTURE) ×8 IMPLANT
SUT VLOC 180 0 9IN  GS21 (SUTURE) ×1
SUT VLOC 180 0 9IN GS21 (SUTURE) ×3 IMPLANT
SYR 30ML LL (SYRINGE) ×4 IMPLANT
SYR 50ML LL SCALE MARK (SYRINGE) ×4 IMPLANT
SYRINGE 10CC LL (SYRINGE) ×4 IMPLANT
SYSTEM CONVERTIBLE TROCAR (TROCAR) IMPLANT
TIP RUMI ORANGE 6.7MMX12CM (TIP) IMPLANT
TIP UTERINE 5.1X6CM LAV DISP (MISCELLANEOUS) IMPLANT
TIP UTERINE 6.7X10CM GRN DISP (MISCELLANEOUS) IMPLANT
TIP UTERINE 6.7X6CM WHT DISP (MISCELLANEOUS) IMPLANT
TIP UTERINE 6.7X8CM BLUE DISP (MISCELLANEOUS) ×4 IMPLANT
TOWEL OR 17X24 6PK STRL BLUE (TOWEL DISPOSABLE) ×12 IMPLANT
TRAY FOLEY BAG SILVER LF 14FR (CATHETERS) ×4 IMPLANT
TROCAR 12M 150ML BLUNT (TROCAR) ×4 IMPLANT
TROCAR DILATING TIP 12MM 150MM (ENDOMECHANICALS) IMPLANT
TROCAR DISP BLADELESS 8 DVNC (TROCAR) ×3 IMPLANT
TROCAR DISP BLADELESS 8MM (TROCAR) ×1
TROCAR XCEL 12X100 BLDLESS (ENDOMECHANICALS) IMPLANT
TROCAR XCEL NON-BLD 5MMX100MML (ENDOMECHANICALS) ×4 IMPLANT
TUBING FILTER THERMOFLATOR (ELECTROSURGICAL) ×4 IMPLANT
WARMER LAPAROSCOPE (MISCELLANEOUS) ×4 IMPLANT
WATER STERILE IRR 1000ML POUR (IV SOLUTION) ×12 IMPLANT

## 2012-12-29 NOTE — H&P (View-Only) (Signed)
57 y.o.MarriedNot Hispanic or Latinofemale presents for preoperative consult for robotic hysterectomy with bilateral salpingectomy for abnormal uterine bleeding and fibroids  .  Pertinent items are noted in HPI. BP 128/78  Pulse 70  Resp 14  Ht 5' 4" (1.626 m)  Wt 166 lb (75.297 kg)  BMI 28.48 kg/m2  LMP 03/15/2012 General appearance: alert, cooperative and appears stated age Lungs: clear to auscultation bilaterally Heart: regular rate and rhythm, S1, S2 normal, no murmur, click, rub or gallop Abdomen: soft, non-tender; bowel sounds normal; no masses,  no organomegaly Pelvic: cervix normal in appearance, external genitalia normal, no adnexal masses or tenderness, no cervical motion tenderness, uterus normal size, shape, and consistency and vagina normal without discharge Extremities: extremities normal, atraumatic, no cyanosis or edema Skin: Skin color, texture, turgor normal. No rashes or lesions Lymph nodes: negative   The procedure was discussed at length, including the expected post-operative course. The ACOG handoutwas not given to the patientprior to the consult.  Pt informed regarding risks and benefits of surgery, including but not limited to bleeding, infections, damage to bowel, bladder and major vessels either during port placement or during the surgical procedure.  Patient was made aware that damage that is seen immediately would be addressed at the time and may require conversion to an open procedure and additional surgical consults.  In addition some damage is delayed and may require return to the operating room in the future.    Risk of blood transfusion from discussed, including the risk of infection from receiving blood was discussed.  It was recommended that the patient does not donate autologously.  The patientwill be accept blood products if deemed necessary.  There is a risk of formation of a deep vein thrombus in the extremities was discussed, although PAS will be  placed to minimize the risk, the patient was informed that a pulmonary embolism could still form and could result in death.  Early ambulation was stressed.  She was instructed on signs and symptoms to be aware of and the need to call if they should develop. In addition the risk of bowel herniation was discussed and signs to be aware of reviewed  All questions were addressed.  Post-operative medications dilaudid, celebrex were given to the patient. She was instructed for clears the day before OR       

## 2012-12-29 NOTE — Anesthesia Preprocedure Evaluation (Addendum)
Anesthesia Evaluation  Patient identified by MRN, date of birth, ID band Patient awake    Reviewed: Allergy & Precautions, H&P , NPO status , Patient's Chart, lab work & pertinent test results  Airway Mallampati: II TM Distance: >3 FB Neck ROM: full    Dental no notable dental hx. (+) Teeth Intact   Pulmonary neg pulmonary ROS,          Cardiovascular negative cardio ROS      Neuro/Psych negative neurological ROS  negative psych ROS   GI/Hepatic negative GI ROS, Neg liver ROS,   Endo/Other  negative endocrine ROS  Renal/GU negative Renal ROS  negative genitourinary   Musculoskeletal negative musculoskeletal ROS (+)   Abdominal Normal abdominal exam  (+)   Peds negative pediatric ROS (+)  Hematology negative hematology ROS (+)   Anesthesia Other Findings   Reproductive/Obstetrics negative OB ROS                           Anesthesia Physical Anesthesia Plan  ASA: II  Anesthesia Plan: General   Post-op Pain Management:    Induction: Intravenous  Airway Management Planned: Oral ETT  Additional Equipment:   Intra-op Plan:   Post-operative Plan: Extubation in OR  Informed Consent: I have reviewed the patients History and Physical, chart, labs and discussed the procedure including the risks, benefits and alternatives for the proposed anesthesia with the patient or authorized representative who has indicated his/her understanding and acceptance.   Dental Advisory Given  Plan Discussed with: CRNA and Surgeon  Anesthesia Plan Comments:         Anesthesia Quick Evaluation

## 2012-12-29 NOTE — Brief Op Note (Signed)
12/29/2012  10:17 PM  PATIENT:  Brooke White  57 y.o. female  PRE-OPERATIVE DIAGNOSIS:  Post menopausal bleeding CPT S2900 [id number]  POST-OPERATIVE DIAGNOSIS:  Post menopausal bleeding  PROCEDURE:  Procedure(s) with comments: ROBOTIC ASSISTED TOTAL HYSTERECTOMY (N/A) CYSTOSCOPY (N/A) - started 1719 BILATERAL SALPINGECTOMY (Bilateral)  SURGEON:  Surgeon(s) and Role:    * Bennye Alm, MD - Primary    * Alison Murray, MD - Assisting  PHYSICIAN ASSISTANT:   ASSISTANTS: none   ANESTHESIA:   general  EBL:  Total I/O In: 120 [P.O.:120] Out: 200 [Urine:200]  BLOOD ADMINISTERED:none  DRAINS: none   LOCAL MEDICATIONS USED:  Amount: 60 ml dilute ropivicaine  SPECIMEN:  Source of Specimen:  uterus, cervix and tubes  DISPOSITION OF SPECIMEN:  PATHOLOGY  COUNTS:  YES  TOURNIQUET:  * No tourniquets in log *  DICTATION: .Other Dictation: Dictation Number [id number]  PLAN OF CARE: Admit for overnight observation  PATIENT DISPOSITION:  Short Stay   Delay start of Pharmacological VTE agent (>24hrs) due to surgical blood loss or risk of bleeding: not applicable

## 2012-12-29 NOTE — Transfer of Care (Signed)
Immediate Anesthesia Transfer of Care Note  Patient: Brooke White  Procedure(s) Performed: Procedure(s) with comments: ROBOTIC ASSISTED TOTAL HYSTERECTOMY (N/A) CYSTOSCOPY (N/A) - started 1719 BILATERAL SALPINGECTOMY (Bilateral)  Patient Location: PACU  Anesthesia Type:General  Level of Consciousness: awake, alert  and oriented  Airway & Oxygen Therapy: Patient Spontanous Breathing and Patient connected to nasal cannula oxygen  Post-op Assessment: Report given to PACU RN and Post -op Vital signs reviewed and stable  Post vital signs: Reviewed and stable  Complications: No apparent anesthesia complications

## 2012-12-29 NOTE — Interval H&P Note (Signed)
History and Physical Interval Note:  12/29/2012 1:11 PM  Brooke White  has presented today for surgery, with the diagnosis of PMB CPT S2900 [phone]  The various methods of treatment have been discussed with the patient and family. After consideration of risks, benefits and other options for treatment, the patient has consented to  Procedure(s): ROBOTIC ASSISTED TOTAL HYSTERECTOMY (N/A) CYSTOSCOPY (N/A) as a surgical intervention .  The patient's history has been reviewed, patient examined, no change in status, stable for surgery.  I have reviewed the patient's chart and labs.  Questions were answered to the patient's satisfaction.     Loxley Cibrian H

## 2012-12-29 NOTE — Anesthesia Procedure Notes (Signed)
Procedure Name: Intubation Date/Time: 12/29/2012 2:21 PM Performed by: Shanon Payor Pre-anesthesia Checklist: Suction available, Emergency Drugs available, Timeout performed, Patient identified and Patient being monitored Patient Re-evaluated:Patient Re-evaluated prior to inductionOxygen Delivery Method: Circle system utilized Preoxygenation: Pre-oxygenation with 100% oxygen Intubation Type: IV induction Ventilation: Two handed mask ventilation required and Oral airway inserted - appropriate to patient size Laryngoscope Size: Mac and 3 Grade View: Grade III Tube type: Oral Tube size: 7.0 mm Number of attempts: 3 Airway Equipment and Method: Stylet and Video-laryngoscopy Placement Confirmation: ETT inserted through vocal cords under direct vision,  positive ETCO2 and breath sounds checked- equal and bilateral Secured at: 23 cm Tube secured with: Tape Dental Injury: Teeth and Oropharynx as per pre-operative assessment  Difficulty Due To: Difficulty was anticipated and Difficult Airway- due to anterior larynx

## 2012-12-29 NOTE — Anesthesia Postprocedure Evaluation (Signed)
Anesthesia Post Note  Patient: Brooke White  Procedure(s) Performed: Procedure(s) (LRB): ROBOTIC ASSISTED TOTAL HYSTERECTOMY (N/A) CYSTOSCOPY (N/A) BILATERAL SALPINGECTOMY (Bilateral)  Anesthesia type: General  Patient location: PACU  Post pain: Pain level controlled  Post assessment: Post-op Vital signs reviewed  Last Vitals:  Filed Vitals:   12/29/12 1900  BP: 123/65  Pulse: 56  Temp:   Resp: 12    Post vital signs: Reviewed  Level of consciousness: sedated  Complications: No apparent anesthesia complications

## 2012-12-30 ENCOUNTER — Encounter (HOSPITAL_COMMUNITY): Payer: Self-pay | Admitting: Gynecology

## 2012-12-30 LAB — CBC
HCT: 37.5 % (ref 36.0–46.0)
MCH: 28.4 pg (ref 26.0–34.0)
MCHC: 32.8 g/dL (ref 30.0–36.0)
MCV: 86.6 fL (ref 78.0–100.0)
Platelets: 141 10*3/uL — ABNORMAL LOW (ref 150–400)
RDW: 14 % (ref 11.5–15.5)

## 2012-12-30 MED ORDER — DSS 100 MG PO CAPS: 100.0000 mg | ORAL_CAPSULE | Freq: Two times a day (BID) | ORAL | Status: DC

## 2012-12-30 MED ORDER — SIMETHICONE 80 MG PO CHEW: 160.0000 mg | CHEWABLE_TABLET | Freq: Four times a day (QID) | ORAL | Status: DC

## 2012-12-30 NOTE — Op Note (Signed)
NAMESHEREE, Brooke White                 ACCOUNT NO.:  [id number]  MEDICAL RECORD NO.:  [id number]  LOCATION:  9310                          FACILITY:  [facility]  PHYSICIAN:  Ivor Costa. Farrel Gobble, M.D. DATE OF BIRTH:  02-Jun-1956  DATE OF PROCEDURE:  12/29/2012 DATE OF DISCHARGE:  12/30/2012                              OPERATIVE REPORT   PREOPERATIVE DIAGNOSES:  Perimenopausal dysfunctional bleeding, fibroid uterus.  POSTOPERATIVE DIAGNOSES:  Perimenopausal dysfunctional bleeding, fibroid uterus.  PROCEDURE:  Robotic-assisted total hysterectomy with bilateral salpingectomies.  Additional procedure was a cystoscopy.  SURGEON:  Ivor Costa. Farrel Gobble, MD  ASSISTANT:  Edwena Felty. Romine, MD.  ANESTHESIA:  General.  IV FLUIDS:  Not documented.  ESTIMATED BLOOD LOSS:  100 mL.  URINE OUTPUT:  200 mL total.  INDICATIONS:  The patient is a perimenopausal female with dysfunctional bleeding, who recently underwent a D and C hysteroscopy for a benign polypectomy.  Her bleeding was well controlled for several months, but then she began to have dysfunctional bleeding.  She was known to have a fibroid uterus, which was felt to be causative of her dysfunctional bleeding and electively presents now for a hysterectomy.  FINDINGS:  Globular appearing uterus consistent with fibroids, patulous cervix, unremarkable tubes and ovaries, unremarkable survey of the upper abdomen.  PATHOLOGY:  Uterus, cervix, and bilateral tubes.  COMPLICATIONS:  None.  DESCRIPTION OF PROCEDURE:  The patient was taken to the operating room. General anesthesia was induced, placed in the dorsal lithotomy position, prepped and draped in usual sterile fashion with careful attention to the arms, shoulders, neck, and knees.  A bivalve speculum was placed in the vagina.  The cervix was visualized.  The cervix was stabilized with a single tooth tenaculum.  The uterus sounded to 9.  A stay suture was then placed anteriorly and  posteriorly.  The cervix was felt to take the large KOH ring with ease.  The uterine manipulator was then advanced through the cervix.  A KOH ring was advanced to the cervix and was felt to be flush with the sutures placed through the KOH ring for later manipulation.  The uterus was then pushed cephalad and the upper most portion of the uterus was palpated abdominally.  From this abdominal incision, we then went slightly over 1 hands breath above the maximal deflection of the uterus to the supraumbilical position.  Gloves were then changed after a Foley had been placed and a supraumbilical incision was then made with scalpel after the skin had been injected with dilute Ropivicaine solution.  The pelvis was entered under direct visualization with the Optiview.  After placement in the pelvis was confirmed,and pneumoperitoneum was then created, 60cc of dilute ropivicaine was placed in the pelvis.  The pelvis was then inspected.  The uterus was as mentioned above and was noted to be freely mobile.  Then two 8 mm ports and a 5 mm operative assistance port were then made under direct visualization.  The patient was then placed in optimal Trendelenburg position to allow the bowels to fall freely from the operative site.  At this point, the robot was docked and Endoshears were then advanced through the operative port.  Using both the PK and Endoshear as well as the assistance port, the right adnexa was elevated. The ureter was identified and peristalsis was noted.  It was noted to Be remote from the operative site.  The tube was then elevated and cauterized and sharply dissected off the underlying ovary.  The dissection carried through using both the PK and Endoshear until the uterine corpus was met.  The round ligament was then grasped, treated with PK cautery, and sharply transected.  The anterior peritoneum was then incised.  Bladder flap was then created moving from the right round ligament  across to the proximal left round ligament.  Loose areolar tissue was then taken down with careful attention to any bleeding, none had been noted.    The tuboovarian ligament was treated with cautery and then transected off the posterior aspect of the uterus.  Once the ovary was able to be successfully retracted from the uterus, the posterior peritoneum was then further dissected down towards the uterosacral ligament on the right.  The uterine vessels were visualized and skeletonized and then similarly treated with PK cautery.  The cautery was then carried down, a small portion of the cervix in order to bring the uterine vessel further away from the vaginal cuff.  The KOH ring was able to be palpated superior to this transection of the uterine vessels.  The uterine vessels were then transected with careful attention to the underlying bleeding and was felt to be complete.  At this point, the uterus was rotated towards the left and in similar fashion, the ureter was identified, the tube was elevated and sharply dissected off the underlying ovary in a similar fashion.  The round ligament was noted to have some scarring to the bowel and this was gently cauterized and transected off.  The round ligament was then successfully transected and the anterior leaf of the bladder flap was then created to meet the prior dissection.  The  uterine vessel again was able to be readily visualized.  The posterior leaf of the broad ligament was then treated with cautery and transected again till the ovary can be removed from the uterus.  The uterine vessels were able to be skeletonized and visualized.  They were treated with PK cautery and transected.  At this point, there was some back bleeding that was appreciated and we turned back to the right adnexa.  A transecting vessel on the right was noted and similarly was treated again with cautery and adequate transection. Attention was then turned back towards  the left uterine.  Because of the aberrant vessel on the right, we similarly looked for an aberrant vessel on the left what was found and it was transected after cautery.  The KOH ring was then elevated.  The uterus was deflected posteriorly.  Then anterior colpotomy was made with the Endoshears in cautery mode.  The dissection was carried through anteriorly to approximate the uterine on the right and then towards the uterine on the left.  There was some additional bleeding that was noted as we approached the round and this was similarly treated with the PK prior to transection.  The dissection then carried through posteriorly the uterosacral ligaments and posterior colpotomy were performed.  The KOH ring was dissected out in its entirety.  It was noted to be some bleeding from the cuff.  Vaginal cuff was noted to be thick, but no active bleeding was appreciated.  The uterus was then passed through the vaginal cuff.  The  cuff was examined for further bleeding, a most of it which was felt could be contained in the cuff closure.  The pelvis was further inspected.  The instruments were changed and a Cobra and needle driver were then advanced into the operative field.  The vagina was then closed in a running locked fashion with a V-Loc suture with careful attention to plicate the vaginal mucosa to the vaginal cuff as well as the posterior peritoneum.  Once the cuff was closed in its entirety, 2 sutures were taken back in order to securely lock the cuff.  At this point, the pelvis was irrigated and the cuff closure seemed to assure [place] of hemostasis throughout.  There was no bleeding noted from the uterine vessels which were reinspected.  The ureters were also visualized and noted to be peristalsing.  We looked under the anterior peritoneum, which similarly appeared to be dry.  The pelvis again was reinspected and no area of bleeding was noted except for one spot on the left ovary which was  treated with gentle cautery. Reinspection assured [place] of hemostasis.  The fluid was then removed from the pelvis.  The needle had been placed in the side wall for later retrieval.  The Cobra and PK were then used with the assistance port to lay Interceed over the operative cuff.  Reinspection assured [place] again of hemostasis and then the suture was removed from the side wall and the ports were removed then under direct visualization.  The pneumoperitoneum was released.  The excess gas was able to be released with manipulation of the supraumbilical port.  The fascia on the supraumbilical port was then closed with a figure-of-eight 0 Vicryl. The skin on all remaining ports were closed with 4-0 PDS and then Dermabond was placed superior to that.  Just prior to this point, the patient had been given indigo carmine for the purpose of cystoscope to be done.  The Foley was removed.  The operative cystoscope 70 degree was then advanced to the urethra.  The urethra was examined and noted to be unremarkable.  Upon entering the cavity, the trigone was inspected and noted to be unremarkable.  Peristalsis of blue dye was seen to both ureteral ports.  The remainder of the bladder was unremarkable.  The air bubble was seen anteriorly.  There was no evidence of either thermal or suture damage to the bladder.  The Foley was then replaced.  A speculum was placed in the vagina.  The cuff was noted to be hemostatic and intact.  The patient tolerated the procedure well.  Sponge, lap, and needle counts were correct x2.  She was given IV Tylenol preoperatively and was given cefotetan intraoperatively.  She was extubated in the OR and was transferred to the recovery room in stable condition.     Ivor Costa. Farrel Gobble, M.D.     THL/MEDQ  D:  12/29/2012  T:  12/30/2012  Job:  [id number]

## 2012-12-30 NOTE — Progress Notes (Signed)
1 Day Post-Op Procedure(s) (LRB): ROBOTIC ASSISTED TOTAL HYSTERECTOMY (N/A) CYSTOSCOPY (N/A) BILATERAL SALPINGECTOMY (Bilateral)  Subjective: Patient reports tolerating PO and no problems voiding.    Objective: I have reviewed patient's vital signs, intake and output, medications and labs.  General: alert, cooperative and appears stated age Resp: clear to auscultation bilaterally Cardio: regular rate and rhythm, S1, S2 normal, no murmur, click, rub or gallop GI: soft, non-tender; bowel sounds normal; no masses,  no organomegaly Extremities: extremities normal, atraumatic, no cyanosis or edema Vaginal Bleeding: none  Assessment: s/p Procedure(s) with comments: ROBOTIC ASSISTED TOTAL HYSTERECTOMY (N/A) CYSTOSCOPY (N/A) - started 1719 BILATERAL SALPINGECTOMY (Bilateral): stable  Plan: Discharge home  LOS: 1 day    Brooke White H 12/30/2012, 9:07 AM

## 2013-01-04 ENCOUNTER — Telehealth: Payer: Self-pay | Admitting: *Deleted

## 2013-01-04 NOTE — Telephone Encounter (Signed)
Call to patient to reschedule appt time to tomorrow to 230.  Patient reports doing well but has had more discomfort today.  Discussed that often as she increases activity she can be more sore and may need to slow down.  Last pain med was on 01-01-13.  Took Tylenol around 6pm.  Discussed Mortrin 800 mg with food approx 9pm.  Can take Q8hrs.  Also try heating pad on low with precautions and will review at MD visit tomm.

## 2013-01-05 ENCOUNTER — Encounter: Payer: Self-pay | Admitting: Gynecology

## 2013-01-05 ENCOUNTER — Ambulatory Visit (INDEPENDENT_AMBULATORY_CARE_PROVIDER_SITE_OTHER): Payer: 59 | Admitting: Gynecology

## 2013-01-05 VITALS — BP 118/76 | HR 72 | Resp 14 | Ht 64.0 in | Wt 162.0 lb

## 2013-01-05 DIAGNOSIS — Z9889 Other specified postprocedural states: Secondary | ICD-10-CM

## 2013-01-05 MED ORDER — ESTRADIOL 0.05 MG/24HR TD PTTW: MEDICATED_PATCH | TRANSDERMAL | Status: DC

## 2013-01-05 MED ORDER — ESTRADIOL 0.05 MG/24HR TD PTTW: 1.0000 | MEDICATED_PATCH | TRANSDERMAL | Status: DC

## 2013-01-05 NOTE — Progress Notes (Signed)
Subjective:     Brooke White is a 57 y.o. female who presents to the clinic 1 weeks status post laparoscopic assisted vaginal hysterectomy for fibroids. Eating a regular diet without difficulty. Bowel movements are normal. Pain is controlled with current analgesics. Medications being used: ibuprofen (OTC). pt has used some diluadid last night afer overdoing it.  Pt reports some hot flashes and hot at night but no sweats.  The following portions of the patient's history were reviewed and updated as appropriate: allergies, current medications, past family history, past medical history, past social history, past surgical history and problem list.  Review of Systems Pertinent items are noted in HPI.    Objective:    BP 118/76  Pulse 72  Resp 14  Ht 5\' 4"  (1.626 m)  Wt 162 lb (73.483 kg)  BMI 27.79 kg/m2  LMP 03/15/2012 General:  alert, cooperative and appears stated age  Abdomen: soft, bowel sounds active, non-tender  Incision:   healing well, no drainage, no erythema, no dehiscence, incision well approximated     Assessment:    Doing well postoperatively. Operative findings again reviewed. Pathology report discussed.    Plan:    1. Continue any current medications. 2. Wound care discussed. 3. Activity restrictions: no lifting more than 15 pounds and no sports 4. Anticipated return to work: 2-3 weeks. 5. Follow up: 3 week  Will begin estrogen therapy, see orders

## 2013-01-15 ENCOUNTER — Other Ambulatory Visit: Payer: Self-pay | Admitting: *Deleted

## 2013-01-15 DIAGNOSIS — E785 Hyperlipidemia, unspecified: Secondary | ICD-10-CM

## 2013-01-15 MED ORDER — ATORVASTATIN CALCIUM 20 MG PO TABS: 20.0000 mg | ORAL_TABLET | Freq: Every day | ORAL | Status: DC

## 2013-01-21 ENCOUNTER — Ambulatory Visit (INDEPENDENT_AMBULATORY_CARE_PROVIDER_SITE_OTHER): Payer: 59 | Admitting: Gynecology

## 2013-01-21 VITALS — BP 112/60 | HR 70 | Resp 16 | Ht 64.0 in | Wt 162.0 lb

## 2013-01-21 DIAGNOSIS — Z9889 Other specified postprocedural states: Secondary | ICD-10-CM

## 2013-01-21 NOTE — Patient Instructions (Signed)
No prolonged standing rest as needed No lifting over 15# No repetitive bending or lifting Return to office 5w

## 2013-01-21 NOTE — Progress Notes (Signed)
Subjective:     Patient ID: Brooke White, female   DOB: 09/27/1955, 57 y.o.   MRN: [id number]  HPI Comments: Pt is here for post-op visit, now 3w after robotic hysterectomy.  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  Pt is concerned regarding the lifting and standing.  Pt reports fatigue with prolonged standing and needs to sit to feel better.  Denies any issues with bladder and bowels.  Eating regular diet, stopped bleeding.  Tolerating estrogen patches, initial nausea resolved.    Review of Systems  All other systems reviewed and are negative.       Objective:   Physical Exam  Constitutional: She is oriented to person, place, and time. She appears well-developed and well-nourished.  Neurological: She is alert and oriented to person, place, and time.  Pelvic exam: VULVA: normal appearing vulva with no masses, tenderness or lesions, VAGINA: normal appearing vagina with normal color and discharge, no lesions. incisions healing well      Assessment:     3 weeks post op     Plan:     May return to work with limitations on lifting and prolonged standing May slowly increase activities as tolerated Pelvic rest

## 2013-01-22 ENCOUNTER — Ambulatory Visit: Payer: Self-pay | Admitting: Gynecology

## 2013-01-28 ENCOUNTER — Ambulatory Visit: Payer: Self-pay | Admitting: Gynecology

## 2013-01-28 ENCOUNTER — Other Ambulatory Visit: Payer: Self-pay | Admitting: *Deleted

## 2013-01-28 DIAGNOSIS — E785 Hyperlipidemia, unspecified: Secondary | ICD-10-CM

## 2013-01-28 MED ORDER — ATORVASTATIN CALCIUM 20 MG PO TABS: 20.0000 mg | ORAL_TABLET | Freq: Every day | ORAL | Status: DC

## 2013-02-22 ENCOUNTER — Ambulatory Visit (INDEPENDENT_AMBULATORY_CARE_PROVIDER_SITE_OTHER): Payer: 59 | Admitting: Gynecology

## 2013-02-22 VITALS — BP 122/76 | HR 74 | Resp 16 | Ht 64.0 in | Wt 162.0 lb

## 2013-02-22 DIAGNOSIS — D251 Intramural leiomyoma of uterus: Secondary | ICD-10-CM

## 2013-02-22 DIAGNOSIS — Z9889 Other specified postprocedural states: Secondary | ICD-10-CM

## 2013-02-22 MED ORDER — ESTRADIOL 0.05 MG/24HR TD PTTW: 1.0000 | MEDICATED_PATCH | TRANSDERMAL | Status: DC

## 2013-02-22 NOTE — Patient Instructions (Signed)
Ok to resume usual activities Release to normal work duties

## 2013-02-22 NOTE — Progress Notes (Signed)
Subjective:     Patient ID: Brooke White, female   DOB: 18-Apr-1956, 57 y.o.   MRN: [id number]  HPI Comments: Pt now 5w post op after robotic hysterectomy.  She is without complaints, vivelle patch working well.  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  Feels good at school, has enough energy for full day.  Not sexually active    Review of Systems  Genitourinary: Negative for flank pain, vaginal bleeding and vaginal discharge.       Objective:   Physical Exam  Constitutional: She is oriented to person, place, and time. She appears well-developed and well-nourished.  Neurological: She is alert and oriented to person, place, and time.  Pelvic exam:  VULVA: normal appearing vulva with no masses, tenderness or lesions,  VAGINA: normal appearing vagina with normal color and discharge, no lesions, granulation tissue noted in 2 sites at cuff,  CERVIX: surgically absent,  UTERUS: surgically absent, vaginal cuff well healed,  ADNEXA: no masses.       Assessment:     8w post-op granulation tissue  Hormone replacement     Plan:     May resume usual activities Granulation tissue treated with silver nitrate, pt instructed re follow up prn Samples of vivelle dot given

## 2013-04-22 ENCOUNTER — Other Ambulatory Visit: Payer: Self-pay

## 2013-05-21 ENCOUNTER — Other Ambulatory Visit: Payer: Self-pay | Admitting: Cardiovascular Disease

## 2013-06-23 ENCOUNTER — Other Ambulatory Visit: Payer: Self-pay | Admitting: Cardiovascular Disease

## 2013-07-23 ENCOUNTER — Other Ambulatory Visit: Payer: Self-pay | Admitting: Cardiovascular Disease

## 2013-07-28 ENCOUNTER — Other Ambulatory Visit: Payer: Self-pay

## 2013-08-09 ENCOUNTER — Other Ambulatory Visit: Payer: Self-pay | Admitting: Family Medicine

## 2013-08-09 ENCOUNTER — Ambulatory Visit
Admission: RE | Admit: 2013-08-09 | Discharge: 2013-08-09 | Disposition: A | Discharge status: Home / Self Care | Payer: 59 | Source: Ambulatory Visit | Attending: Family Medicine | Admitting: Family Medicine

## 2013-08-09 DIAGNOSIS — M545 Low back pain, unspecified: Secondary | ICD-10-CM

## 2013-11-16 ENCOUNTER — Other Ambulatory Visit: Payer: Self-pay | Admitting: Cardiovascular Disease

## 2013-12-14 ENCOUNTER — Ambulatory Visit (INDEPENDENT_AMBULATORY_CARE_PROVIDER_SITE_OTHER): Payer: 59 | Admitting: Cardiovascular Disease

## 2013-12-14 ENCOUNTER — Encounter: Payer: Self-pay | Admitting: Cardiovascular Disease

## 2013-12-14 ENCOUNTER — Ambulatory Visit (INDEPENDENT_AMBULATORY_CARE_PROVIDER_SITE_OTHER): Payer: 59 | Admitting: *Deleted

## 2013-12-14 VITALS — BP 120/98 | HR 62 | Ht 64.0 in | Wt 158.8 lb

## 2013-12-14 DIAGNOSIS — E785 Hyperlipidemia, unspecified: Secondary | ICD-10-CM

## 2013-12-14 DIAGNOSIS — I059 Rheumatic mitral valve disease, unspecified: Secondary | ICD-10-CM

## 2013-12-14 DIAGNOSIS — I10 Essential (primary) hypertension: Secondary | ICD-10-CM

## 2013-12-14 DIAGNOSIS — I341 Nonrheumatic mitral (valve) prolapse: Secondary | ICD-10-CM

## 2013-12-14 LAB — HEPATIC FUNCTION PANEL
ALT: 43 U/L — ABNORMAL HIGH (ref 0–35)
AST: 32 U/L (ref 0–37)
Albumin: 4.2 g/dL (ref 3.5–5.2)
Alkaline Phosphatase: 63 U/L (ref 39–117)
BILIRUBIN DIRECT: 0 mg/dL (ref 0.0–0.3)
BILIRUBIN TOTAL: 0.6 mg/dL (ref 0.2–1.2)
TOTAL PROTEIN: 7 g/dL (ref 6.0–8.3)

## 2013-12-14 LAB — LIPID PANEL
CHOL/HDL RATIO: 3
Cholesterol: 179 mg/dL (ref 0–200)
HDL: 67 mg/dL (ref >39.00)
LDL CALC: 91 mg/dL (ref 0–99)
NonHDL: 112
TRIGLYCERIDES: 105 mg/dL (ref 0.0–149.0)
VLDL: 21 mg/dL (ref 0.0–40.0)

## 2013-12-14 LAB — BASIC METABOLIC PANEL
BUN: 15 mg/dL (ref 6–23)
CHLORIDE: 103 meq/L (ref 96–112)
CO2: 30 meq/L (ref 19–32)
CREATININE: 0.8 mg/dL (ref 0.4–1.2)
Calcium: 9.2 mg/dL (ref 8.4–10.5)
GFR: 76.63 mL/min (ref >60.00)
Glucose, Bld: 98 mg/dL (ref 70–99)
Potassium: 3.6 mEq/L (ref 3.5–5.1)
Sodium: 138 mEq/L (ref 135–145)

## 2013-12-14 MED ORDER — HYDROCHLOROTHIAZIDE 25 MG PO TABS
25.0000 mg | ORAL_TABLET | Freq: Every day | ORAL | Status: DC
Start: 2013-12-14 — End: 2014-11-15

## 2013-12-14 MED ORDER — POTASSIUM CHLORIDE ER 10 MEQ PO TBCR: 10.0000 meq | EXTENDED_RELEASE_TABLET | Freq: Every day | ORAL | Status: DC

## 2013-12-14 NOTE — Assessment & Plan Note (Signed)
Fasting lipids, liver enzymes, and basic metabolic profile were drawn today. The results are currently pending.

## 2013-12-14 NOTE — Patient Instructions (Addendum)
Your physician has recommended you make the following change in your medication:  START HCTZ (Hydrachlorothiazide 25 mg) once daily START K-Dur (potassium supplement) 10 mEq once daily  Your physician recommends that you schedule a follow-up appointment in: 1 month with the nurse for BP check and lab work (basic metabolic profile) - Wednesday July 29 @ 9:00  Your physician wants you to follow-up in: 6 months with Dr. Acie Fredrickson.  You will receive a reminder letter in the mail two months in advance. If you don't receive a letter, please call our office to schedule the follow-up appointment.

## 2013-12-14 NOTE — Assessment & Plan Note (Signed)
Her diastolic blood pressure is still mildly elevated. She's been working on diet and exercise program. We'll add hydrochlorothiazide 25 mg a day and potassium chloride 10 mEq a day.

## 2013-12-14 NOTE — Progress Notes (Signed)
Brooke White Date of Birth  11-06-1955 [facility] [address]    [place]   [place], [place]  [address]    [place],   [phone]  Fax  [phone]  [phone]  Fax [phone]  Problem List: 1. Mitral Valve prolapse 2. Hyperlipidemia   History of Present Illness:  Brooke White is a 58 year old female with a history of mitral valve prolapse, hyperlipidemia, and hypertension. She's done very well since I last saw her a year and half ago. She has not had any episodes of chest pain or shortness of breath. She's been able to do all of her normal activities without any significant problems.  December 09, 2012:  No new cardiac complaints.  She is having a hysterectomy.  She has uterine polyps and is having lots of bleeding.  She has a grandson as of last October.  She is not exercising much recently.    December 14, 2013:  She has had a hysterectomy last July.  She has had back pain and is in physical therapy.  She has lost weight ( which she had gained after surgery).  No CP or dyspnea.    Current Outpatient Prescriptions on File Prior to Visit  Medication Sig Dispense Refill  . atorvastatin (LIPITOR) 20 MG tablet TAKE 1 TABLET BY MOUTH DAILY  30 tablet  0  . calcium carbonate (TUMS - DOSED IN MG ELEMENTAL CALCIUM) 500 MG chewable tablet Chew 1 tablet by mouth daily.      . cetirizine (ZYRTEC) 10 MG tablet Take 10 mg by mouth daily as needed. For allergies      . estradiol (MINIVELLE) 0.05 MG/24HR Apply anywhere on lower abdomen. Every 3 and 4 days  24 patch  3  . estradiol (VIVELLE-DOT) 0.05 MG/24HR patch Place 1 patch (0.05 mg total) onto the skin 2 (two) times a week.  20 patch  0  . Multiple Vitamins-Minerals (MULTIVITAMIN WITH MINERALS) tablet Take 1 tablet by mouth daily.       No current facility-administered medications on file prior to visit.    No Known Allergies  Past Medical History  Diagnosis Date  . MVP  (mitral valve prolapse) 01/27/2001    LAST ECHO 07/02/2005 Impression normal LV function, mild MVP with trivial MR, mild tricuspid regurgitation, trivial pulmonic insufficiency.   . Hyperlipemia   . Post-menopausal bleeding 2014    endometrial polyp  . Elevated cholesterol   . HTN (hypertension)     blood pressure under control-meds stopped approx 2011    Past Surgical History  Procedure Laterality Date  . Dilation and curettage of uterus  2005  . Bunionectomy  2004  . Tubal ligation  2002  . Dilatation & currettage/hysteroscopy with resectocope N/A 08/03/2012    Procedure: Pollyann Glen WITH RESECTOCOPE;  Surgeon: Azalia Bilis, MD;  Location: [facility];  Service: Gynecology;  Laterality: N/A;  . Hysteroscopy  07/2012    2007   . Root canal    . Vein surgery Bilateral   . Robotic assisted total hysterectomy N/A 12/29/2012    Procedure: ROBOTIC ASSISTED TOTAL HYSTERECTOMY;  Surgeon: Azalia Bilis, MD;  Location: [facility];  Service: Gynecology;  Laterality: N/A;  . Cystoscopy N/A 12/29/2012    Procedure: CYSTOSCOPY;  Surgeon: Azalia Bilis, MD;  Location: [facility];  Service: Gynecology;  Laterality: N/A;  started 1719  . Bilateral salpingectomy Bilateral 12/29/2012    Procedure: BILATERAL  SALPINGECTOMY;  Surgeon: Azalia Bilis, MD;  Location: [facility];  Service: Gynecology;  Laterality: Bilateral;    History  Smoking status  . Never Smoker   Smokeless tobacco  . Never Used    History  Alcohol Use No    Family History  Problem Relation Age of Onset  . Hypertension    . Heart disease      Reviw of Systems:  Reviewed in the HPI.  All other systems are negative.  Physical Exam: Blood pressure 120/98, pulse 62, height 5\' 4"  (1.626 m), weight 158 lb 12.8 oz (72.031 kg), last menstrual period 03/15/2012. General: Well developed, well nourished, in no acute distress.  Head: Normocephalic, atraumatic, sclera non-icteric, mucus membranes are moist,   Neck: Supple. Carotids are 2 +  without bruits. No JVD  Lungs: Clear bilaterally to auscultation.  Heart: regular rate.  normal  S1 S2. There is a very soft systolic murmur at the left sternal border.  Abdomen: Soft, non-tender, non-distended with normal bowel sounds. No hepatomegaly. No rebound/guarding. No masses.  Msk:  Strength and tone are normal  Extremities: No clubbing or cyanosis. No edema.  Distal pedal pulses are 2+ and equal bilaterally.  Neuro: Alert and oriented X 3. Moves all extremities spontaneously.  Psych:  Responds to questions appropriately with a normal affect.  ECG: December 14, 2013:  NR at 63.  LAD,  Poor R wave progression. Assessment / Plan:

## 2013-12-15 ENCOUNTER — Other Ambulatory Visit: Payer: Self-pay | Admitting: Cardiovascular Disease

## 2014-01-12 ENCOUNTER — Other Ambulatory Visit (INDEPENDENT_AMBULATORY_CARE_PROVIDER_SITE_OTHER): Payer: 59

## 2014-01-12 ENCOUNTER — Ambulatory Visit (INDEPENDENT_AMBULATORY_CARE_PROVIDER_SITE_OTHER): Payer: 59 | Admitting: Cardiology

## 2014-01-12 VITALS — BP 123/90 | HR 67

## 2014-01-12 DIAGNOSIS — E785 Hyperlipidemia, unspecified: Secondary | ICD-10-CM

## 2014-01-12 DIAGNOSIS — I10 Essential (primary) hypertension: Secondary | ICD-10-CM

## 2014-01-12 LAB — BASIC METABOLIC PANEL
BUN: 17 mg/dL (ref 6–23)
CHLORIDE: 100 meq/L (ref 96–112)
CO2: 31 mEq/L (ref 19–32)
Calcium: 9.3 mg/dL (ref 8.4–10.5)
Creatinine, Ser: 0.8 mg/dL (ref 0.4–1.2)
GFR: 75.55 mL/min (ref >60.00)
Glucose, Bld: 103 mg/dL — ABNORMAL HIGH (ref 70–99)
POTASSIUM: 3.7 meq/L (ref 3.5–5.1)
SODIUM: 137 meq/L (ref 135–145)

## 2014-01-12 NOTE — Progress Notes (Signed)
  1. Reason for visit: BP Check after starting HCTZ 25 mg 1 tab qd and Klor-Con 10 MEQ 1 tab qd.  2. Name of MD requesting visit: Dr. Acie Fredrickson  3. H&P: NONE  4.   ROS related to problem: NONE  5. Assessment and plan per MD: Per Dr. Acie Fredrickson pt should continue current medications and have BMet drawn today. F/u as scheduled.

## 2014-06-02 ENCOUNTER — Other Ambulatory Visit: Payer: Self-pay | Admitting: Otolaryngology

## 2014-06-02 DIAGNOSIS — H6980 Other specified disorders of Eustachian tube, unspecified ear: Secondary | ICD-10-CM

## 2014-06-02 DIAGNOSIS — H905 Unspecified sensorineural hearing loss: Secondary | ICD-10-CM

## 2014-06-02 DIAGNOSIS — Z8669 Personal history of other diseases of the nervous system and sense organs: Secondary | ICD-10-CM

## 2014-06-02 DIAGNOSIS — H93299 Other abnormal auditory perceptions, unspecified ear: Secondary | ICD-10-CM

## 2014-06-08 ENCOUNTER — Ambulatory Visit
Admission: RE | Admit: 2014-06-08 | Discharge: 2014-06-08 | Disposition: A | Discharge status: Home / Self Care | Payer: 59 | Source: Ambulatory Visit | Attending: Otolaryngology | Admitting: Otolaryngology

## 2014-06-08 DIAGNOSIS — Z8669 Personal history of other diseases of the nervous system and sense organs: Secondary | ICD-10-CM

## 2014-06-08 DIAGNOSIS — H905 Unspecified sensorineural hearing loss: Secondary | ICD-10-CM

## 2014-06-08 DIAGNOSIS — H6980 Other specified disorders of Eustachian tube, unspecified ear: Secondary | ICD-10-CM

## 2014-06-08 DIAGNOSIS — H93299 Other abnormal auditory perceptions, unspecified ear: Secondary | ICD-10-CM

## 2014-06-24 ENCOUNTER — Other Ambulatory Visit: Payer: Self-pay | Admitting: Cardiovascular Disease

## 2014-08-12 ENCOUNTER — Encounter: Payer: Self-pay | Admitting: Cardiovascular Disease

## 2014-08-12 ENCOUNTER — Ambulatory Visit (INDEPENDENT_AMBULATORY_CARE_PROVIDER_SITE_OTHER): Payer: 59 | Admitting: Cardiovascular Disease

## 2014-08-12 VITALS — BP 122/70 | HR 78 | Ht 64.0 in | Wt 164.4 lb

## 2014-08-12 DIAGNOSIS — I10 Essential (primary) hypertension: Secondary | ICD-10-CM

## 2014-08-12 DIAGNOSIS — I341 Nonrheumatic mitral (valve) prolapse: Secondary | ICD-10-CM

## 2014-08-12 MED ORDER — POTASSIUM CHLORIDE ER 10 MEQ PO TBCR: 10.0000 meq | EXTENDED_RELEASE_TABLET | Freq: Two times a day (BID) | ORAL | Status: DC

## 2014-08-12 NOTE — Progress Notes (Signed)
Cardiology Office Note   Date:  08/12/2014   ID:  [name], DOB 08-17-1955, MRN [id number]  PCP:  Reginia Naas, MD  Cardiologist:   Thayer Headings, MD   Chief Complaint  Patient presents with  . Follow-up    mitral valve prolapse   1. Mitral Valve prolapse 2. Hyperlipidemia   History of Present Illness:  Brooke White is a 59 year old female with a history of mitral valve prolapse, hyperlipidemia, and hypertension. She's done very well since I last saw her a year and half ago. She has not had any episodes of chest pain or shortness of breath. She's been able to do all of her normal activities without any significant problems.  December 09, 2012:  No new cardiac complaints. She is having a hysterectomy. She has uterine polyps and is having lots of bleeding.  She has a grandson as of last October. She is not exercising much recently.   December 14, 2013:  She has had a hysterectomy last July. She has had back pain and is in physical therapy. She has lost weight ( which she had gained after surgery). No CP or dyspnea.    History of Present Illness: Brooke SCHILTZ is a 59 y.o. female who presents for follow up of her MVP.   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     Past Medical History  Diagnosis Date  . MVP (mitral valve prolapse) 01/27/2001    LAST ECHO 07/02/2005 Impression normal LV function, mild MVP with trivial MR, mild tricuspid regurgitation, trivial pulmonic insufficiency.   . Hyperlipemia   . Post-menopausal bleeding 2014    endometrial polyp  . Elevated cholesterol   . HTN (hypertension)     blood pressure under control-meds stopped approx 2011    Past Surgical History  Procedure Laterality Date  . Dilation and curettage of uterus  2005  . Bunionectomy  2004  . Tubal ligation  2002  . Dilatation & currettage/hysteroscopy with resectocope N/A 08/03/2012    Procedure: Pollyann Glen WITH RESECTOCOPE;  Surgeon: Azalia Bilis, MD;   Location: [facility];  Service: Gynecology;  Laterality: N/A;  . Hysteroscopy  07/2012    2007   . Root canal    . Vein surgery Bilateral   . Robotic assisted total hysterectomy N/A 12/29/2012    Procedure: ROBOTIC ASSISTED TOTAL HYSTERECTOMY;  Surgeon: Azalia Bilis, MD;  Location: [facility];  Service: Gynecology;  Laterality: N/A;  . Cystoscopy N/A 12/29/2012    Procedure: CYSTOSCOPY;  Surgeon: Azalia Bilis, MD;  Location: [facility];  Service: Gynecology;  Laterality: N/A;  started 1719  . Bilateral salpingectomy Bilateral 12/29/2012    Procedure: BILATERAL SALPINGECTOMY;  Surgeon: Azalia Bilis, MD;  Location: [facility];  Service: Gynecology;  Laterality: Bilateral;     Current Outpatient Prescriptions  Medication Sig Dispense Refill  . atorvastatin (LIPITOR) 20 MG tablet TAKE 1 TABLET BY MOUTH EVERY DAY 30 tablet 3  . calcium carbonate (TUMS - DOSED IN MG ELEMENTAL CALCIUM) 500 MG chewable tablet Chew 1 tablet by mouth daily.    . cetirizine (ZYRTEC) 10 MG tablet Take 10 mg by mouth daily as needed. For allergies    . hydrochlorothiazide (HYDRODIURIL) 25 MG tablet Take 1 tablet (25 mg total) by mouth daily. 90 tablet 3  . Multiple Vitamins-Minerals (MULTIVITAMIN WITH MINERALS) tablet Take 1 tablet by mouth daily.    . potassium chloride (K-DUR) 10 MEQ tablet Take 1 tablet (  10 mEq total) by mouth daily. 90 tablet 3   No current facility-administered medications for this visit.    Allergies:   Review of patient's allergies indicates no known allergies.    Social History:  The patient  reports that she has never smoked. She has never used smokeless tobacco. She reports that she does not drink alcohol or use illicit drugs.   Family History:  The patient's family history includes Heart disease in an other family member; Hypertension in an other family member.    ROS:  Please see the history of present illness.    Review of Systems: Constitutional:  denies fever, chills, diaphoresis,  appetite change and fatigue.  HEENT: denies photophobia, eye pain, redness, hearing loss, ear pain, congestion, sore throat, rhinorrhea, sneezing, neck pain, neck stiffness and tinnitus.  Respiratory: denies SOB, DOE, cough, chest tightness, and wheezing.  Cardiovascular: denies chest pain, palpitations and leg swelling.  Gastrointestinal: denies nausea, vomiting, abdominal pain, diarrhea, constipation, blood in stool.  Genitourinary: denies dysuria, urgency, frequency, hematuria, flank pain and difficulty urinating.  Musculoskeletal: denies  myalgias, back pain, joint swelling, arthralgias and gait problem.   Skin: denies pallor, rash and wound.  Neurological: denies dizziness, seizures, syncope, weakness, light-headedness, numbness and headaches.   Hematological: denies adenopathy, easy bruising, personal or family bleeding history.  Psychiatric/ Behavioral: denies suicidal ideation, mood changes, confusion, nervousness, sleep disturbance and agitation.       All other systems are reviewed and negative.    PHYSICAL EXAM: VS:  BP 122/70 mmHg  Pulse 78  Ht 5\' 4"  (1.626 m)  Wt 164 lb 6.4 oz (74.571 kg)  BMI 28.21 kg/m2  SpO2 97%  LMP 03/15/2012 , BMI Body mass index is 28.21 kg/(m^2). GEN: Well nourished, well developed, in no acute distress HEENT: normal Neck: no JVD, carotid bruits, or masses Cardiac: RRR;  Soft systolic  murmur, rubs, or gallops,no edema  Respiratory:  clear to auscultation bilaterally, normal work of breathing GI: soft, nontender, nondistended, + BS MS: no deformity or atrophy Skin: warm and dry, no rash Neuro:  Strength and sensation are intact Psych: normal   EKG:  EKG is not ordered today. The ekg ordered today demonstrates    Recent Labs: 12/14/2013: ALT 43* 01/12/2014: BUN 17; Creatinine 0.8; Potassium 3.7; Sodium 137    Lipid Panel    Component Value Date/Time   CHOL 179 12/14/2013 0744   TRIG 105.0 12/14/2013 0744   HDL 67.00 12/14/2013  0744   CHOLHDL 3 12/14/2013 0744   VLDL 21.0 12/14/2013 0744   LDLCALC 91 12/14/2013 0744      Wt Readings from Last 3 Encounters:  08/12/14 164 lb 6.4 oz (74.571 kg)  12/14/13 158 lb 12.8 oz (72.031 kg)  02/22/13 162 lb (73.483 kg)      Other studies Reviewed: Additional studies/ records that were reviewed today include: . Review of the above records demonstrates:    ASSESSMENT AND PLAN:  1. Essential hypertension: Lorette presents for follow-up of her hypertension. Her blood pressure is well-controlled. Her potassium level was slightly low at her last office visit so we will increase her potassium chloride to 10 mEq twice a day. We'll check her levels again in one month.  I have encouraged her to exercise on a regular basis. She'll continue to watch her salt intake. I'll see her again in 6 months for follow-up office visit.  2.  Mitral valve prolapse: Patient has a very soft murmur. I did not hear a mitral  valve click. We will continue to follow.  3. Hyperlipidemia:   Continue current dose of atorvastatin. We'll check fasting lipids at her next office visit.   Current medicines are reviewed at length with the patient today.  The patient does not have concerns regarding medicines.  The following changes have been made:  no change   Disposition:   FU with me in 6 months     Signed, Amparo Donalson, Wonda Cheng, MD  08/12/2014 4:18 PM    [facility] [address], [place], [place]  [address] Phone: [phone]; Fax: [phone]

## 2014-08-12 NOTE — Addendum Note (Signed)
Addended by: Michae Kava on: 08/12/2014 04:37 PM   Modules accepted: Orders

## 2014-08-12 NOTE — Patient Instructions (Addendum)
Your physician recommends that you return for lab work in: 09/09/14 FOR BMET  INCREASE POTASSIUM TO 10 MEQ TWICE DAILY; NEW RX SENT IN   Your physician wants you to follow-up in: [place] DR. Acie Fredrickson You will receive a reminder letter in the mail two months in advance. If you don't receive a letter, please call our office to schedule the follow-up appointment.  FASTING LIPID AND LIVER IN 6 MONTHS

## 2014-09-01 ENCOUNTER — Other Ambulatory Visit: Payer: Self-pay | Admitting: Cardiovascular Disease

## 2014-09-09 ENCOUNTER — Other Ambulatory Visit (INDEPENDENT_AMBULATORY_CARE_PROVIDER_SITE_OTHER): Payer: 59 | Admitting: *Deleted

## 2014-09-09 DIAGNOSIS — I10 Essential (primary) hypertension: Secondary | ICD-10-CM | POA: Diagnosis not present

## 2014-09-09 LAB — BASIC METABOLIC PANEL
BUN: 19 mg/dL (ref 6–23)
CALCIUM: 9 mg/dL (ref 8.4–10.5)
CO2: 28 mEq/L (ref 19–32)
CREATININE: 0.92 mg/dL (ref 0.50–1.10)
Chloride: 104 mEq/L (ref 96–112)
Glucose, Bld: 98 mg/dL (ref 70–99)
Potassium: 3.7 mEq/L (ref 3.5–5.3)
Sodium: 141 mEq/L (ref 135–145)

## 2014-09-09 NOTE — Addendum Note (Signed)
Addended by: Eulis Foster on: 09/09/2014 08:58 AM   Modules accepted: Orders

## 2014-09-13 ENCOUNTER — Telehealth: Payer: Self-pay | Admitting: Nurse Practitioner

## 2014-09-13 DIAGNOSIS — R05 Cough: Secondary | ICD-10-CM

## 2014-09-13 DIAGNOSIS — R059 Cough, unspecified: Secondary | ICD-10-CM

## 2014-09-13 MED ORDER — AMOXICILLIN 875 MG PO TABS: 875.0000 mg | ORAL_TABLET | Freq: Two times a day (BID) | ORAL | Status: DC

## 2014-09-13 NOTE — Progress Notes (Signed)
Amoxicillin 875 1 po bid x10 days

## 2014-09-13 NOTE — Progress Notes (Signed)
We are sorry that you are not feeling well.  Here is how we plan to help!  Based on what you have shared with me it looks like you have upper respiratory tract inflammation that has resulted in a significant cough.  Inflammation and infection in the upper respiratory tract is commonly called bronchitis and has four common causes:  Allergies, Viral Infections, Acid Reflux and Bacterial Infections.  Allergies, viruses and acid reflux are treated by controlling symptoms or eliminating the cause. An example might be a cough caused by taking certain blood pressure medications. You stop the cough by changing the medication. Another example might be a cough caused by acid reflux. Controlling the reflux helps control the cough.  Based on your presentation I believe you most likely have a cough due to allergies. You have already had an antibiotic. The cough may last awhile. You may want to try nasal spray like flonase and maybe nyquil at night for cough.       HOME CARE . Only take medications as instructed by your medical team. . Complete the entire course of an antibiotic. . Drink plenty of fluids and get plenty of rest. . Avoid close contacts especially the very young and the elderly . Cover your mouth if you cough or cough into your sleeve. . Always remember to wash your hands . A steam or ultrasonic humidifier can help congestion.    GET HELP RIGHT AWAY IF: . You develop worsening fever. . You become short of breath . You cough up blood. . Your symptoms persist after you have completed your treatment plan MAKE SURE YOU   Understand these instructions.  Will watch your condition.  Will get help right away if you are not doing well or get worse.  Your e-visit answers were reviewed by a board certified advanced clinical practitioner to complete your personal care plan.  Depending on the condition, your plan could have included both over the counter or prescription medications.  If there is  a problem please reply  once you have received a response from your provider.  Your safety is important to [place].  If you have drug allergies check your prescription carefully.    You can use MyChart to ask questions about today's visit, request a non-urgent call back, or ask for a work or school excuse.  You will get an e-mail in the next two days asking about your experience.  I hope that your e-visit has been valuable and will speed your recovery. Thank you for using e-visits.

## 2014-09-13 NOTE — Addendum Note (Signed)
Addended by: Chevis Pretty on: 09/13/2014 08:22 PM   Modules accepted: Orders

## 2014-09-13 NOTE — Progress Notes (Signed)
Stigler sorry i miss understood. i will send in

## 2014-11-15 ENCOUNTER — Other Ambulatory Visit: Payer: Self-pay

## 2014-11-15 MED ORDER — HYDROCHLOROTHIAZIDE 25 MG PO TABS: 25.0000 mg | ORAL_TABLET | Freq: Every day | ORAL | Status: DC

## 2015-02-08 ENCOUNTER — Ambulatory Visit (INDEPENDENT_AMBULATORY_CARE_PROVIDER_SITE_OTHER): Payer: 59 | Admitting: Cardiovascular Disease

## 2015-02-08 ENCOUNTER — Other Ambulatory Visit: Payer: 59

## 2015-02-08 VITALS — BP 110/86 | HR 74 | Ht 64.0 in | Wt 167.0 lb

## 2015-02-08 DIAGNOSIS — E785 Hyperlipidemia, unspecified: Secondary | ICD-10-CM

## 2015-02-08 DIAGNOSIS — I10 Essential (primary) hypertension: Secondary | ICD-10-CM

## 2015-02-08 DIAGNOSIS — I341 Nonrheumatic mitral (valve) prolapse: Secondary | ICD-10-CM | POA: Diagnosis not present

## 2015-02-08 NOTE — Patient Instructions (Addendum)
Medication Instructions:  Your physician recommends that you continue on your current medications as directed. Please refer to the Current Medication list given to you today.   Labwork: RETURN on September 13 (anytime between 7:30 am and 5:00 pm) for fasting cholesterol, liver, basic metabolic panel   Testing/Procedures: None Ordered  Follow-Up: Your physician wants you to follow-up in: 1 year with Dr. Acie Fredrickson.  You will receive a reminder letter in the mail two months in advance. If you don't receive a letter, please call our office to schedule the follow-up appointment.

## 2015-02-08 NOTE — Progress Notes (Signed)
Cardiology Office Note   Date:  02/08/2015   ID:  Brooke White, DOB [birth year], MRN [id number]  PCP:  Reginia Naas, MD  Cardiologist:   Thayer Headings, MD   Chief Complaint  Patient presents with  . Follow-up    MVP, HTN   1. Mitral Valve prolapse 2. Hyperlipidemia 3. Essential Hypertension:   History of Present Illness:  Brooke White is a 59 year old female with a history of mitral valve prolapse, hyperlipidemia, and hypertension. She's done very well since I last saw her a year and half ago. She has not had any episodes of chest pain or shortness of breath. She's been able to do all of her normal activities without any significant problems.  December 09, 2012:  No new cardiac complaints. She is having a hysterectomy. She has uterine polyps and is having lots of bleeding.  She has a grandson as of last October. She is not exercising much recently.   December 14, 2013:  She has had a hysterectomy last July. She has had back pain and is in physical therapy. She has lost weight ( which she had gained after surgery). No CP or dyspnea.    History of Present Illness: Brooke White is a 59 y.o. female who presents for follow up of her MVP.   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  Aug. 24 2016:  Has gained some weight. Started exercising 10 days ago  . Had some numbness in her right lower leg for several days. Has resolved since she has started exercising .    Past Medical History  Diagnosis Date  . MVP (mitral valve prolapse) 01/27/2001    LAST ECHO 07/02/2005 Impression normal LV function, mild MVP with trivial MR, mild tricuspid regurgitation, trivial pulmonic insufficiency.   . Hyperlipemia   . Post-menopausal bleeding 2014    endometrial polyp  . Elevated cholesterol   . HTN (hypertension)     blood pressure under control-meds stopped approx 2011    Past Surgical History  Procedure Laterality Date  . Dilation and curettage of uterus  2005    . Bunionectomy  2004  . Tubal ligation  2002  . Dilatation & currettage/hysteroscopy with resectocope N/A 08/03/2012    Procedure: Pollyann Glen WITH RESECTOCOPE;  Surgeon: Azalia Bilis, MD;  Location: [facility];  Service: Gynecology;  Laterality: N/A;  . Hysteroscopy  07/2012    2007   . Root canal    . Vein surgery Bilateral   . Robotic assisted total hysterectomy N/A 12/29/2012    Procedure: ROBOTIC ASSISTED TOTAL HYSTERECTOMY;  Surgeon: Azalia Bilis, MD;  Location: [facility];  Service: Gynecology;  Laterality: N/A;  . Cystoscopy N/A 12/29/2012    Procedure: CYSTOSCOPY;  Surgeon: Azalia Bilis, MD;  Location: [facility];  Service: Gynecology;  Laterality: N/A;  started 1719  . Bilateral salpingectomy Bilateral 12/29/2012    Procedure: BILATERAL SALPINGECTOMY;  Surgeon: Azalia Bilis, MD;  Location: [facility];  Service: Gynecology;  Laterality: Bilateral;     Current Outpatient Prescriptions  Medication Sig Dispense Refill  . amoxicillin (AMOXIL) 875 MG tablet Take 1 tablet (875 mg total) by mouth 2 (two) times daily. 1 po BID 20 tablet 0  . atorvastatin (LIPITOR) 20 MG tablet TAKE 1 TABLET BY MOUTH EVERY DAY 90 tablet 3  . calcium carbonate (TUMS - DOSED IN MG ELEMENTAL CALCIUM) 500 MG chewable tablet Chew 1 tablet by mouth daily.    . cetirizine (  ZYRTEC) 10 MG tablet Take 10 mg by mouth daily as needed. For allergies    . hydrochlorothiazide (HYDRODIURIL) 25 MG tablet Take 1 tablet (25 mg total) by mouth daily. 90 tablet 2  . Multiple Vitamins-Minerals (MULTIVITAMIN WITH MINERALS) tablet Take 1 tablet by mouth daily.    . potassium chloride (K-DUR) 10 MEQ tablet Take 1 tablet (10 mEq total) by mouth 2 (two) times daily. 180 tablet 3   No current facility-administered medications for this visit.    Allergies:   Review of patient's allergies indicates no known allergies.    Social History:  The patient  reports that she has never smoked. She has never used smokeless tobacco. She reports  that she does not drink alcohol or use illicit drugs.   Family History:  The patient's family history includes Heart disease in an other family member; Hypertension in an other family member.    ROS:  Please see the history of present illness.    Review of Systems: Constitutional:  denies fever, chills, diaphoresis, appetite change and fatigue.  HEENT: denies photophobia, eye pain, redness, hearing loss, ear pain, congestion, sore throat, rhinorrhea, sneezing, neck pain, neck stiffness and tinnitus.  Respiratory: denies SOB, DOE, cough, chest tightness, and wheezing.  Cardiovascular: denies chest pain, palpitations and leg swelling.  Gastrointestinal: denies nausea, vomiting, abdominal pain, diarrhea, constipation, blood in stool.  Genitourinary: denies dysuria, urgency, frequency, hematuria, flank pain and difficulty urinating.  Musculoskeletal: denies  myalgias, back pain, joint swelling, arthralgias and gait problem.   Skin: denies pallor, rash and wound.  Neurological: denies dizziness, seizures, syncope, weakness, light-headedness, numbness and headaches.   Hematological: denies adenopathy, easy bruising, personal or family bleeding history.  Psychiatric/ Behavioral: denies suicidal ideation, mood changes, confusion, nervousness, sleep disturbance and agitation.       All other systems are reviewed and negative.    PHYSICAL EXAM: VS:  BP 110/86 mmHg  Pulse 74  Wt 75.751 kg (167 lb)  LMP 03/15/2012 , BMI Body mass index is 28.65 kg/(m^2). GEN: Well nourished, well developed, in no acute distress HEENT: normal Neck: no JVD, carotid bruits, or masses Cardiac: RRR;  Soft systolic  murmur, rubs, or gallops,no edema  Respiratory:  clear to auscultation bilaterally, normal work of breathing GI: soft, nontender, nondistended, + BS MS: no deformity or atrophy Skin: warm and dry, no rash Neuro:  Strength and sensation are intact Psych: normal   EKG:  EKG is ordered today. The  ekg ordered today demonstrates  NSR at 74.  LAD , no changes.    Recent Labs: 09/09/2014: BUN 19; Creat 0.92; Potassium 3.7; Sodium 141    Lipid Panel    Component Value Date/Time   CHOL 179 12/14/2013 0744   TRIG 105.0 12/14/2013 0744   HDL 67.00 12/14/2013 0744   CHOLHDL 3 12/14/2013 0744   VLDL 21.0 12/14/2013 0744   LDLCALC 91 12/14/2013 0744      Wt Readings from Last 3 Encounters:  02/08/15 75.751 kg (167 lb)  08/12/14 74.571 kg (164 lb 6.4 oz)  12/14/13 72.031 kg (158 lb 12.8 oz)      Other studies Reviewed: Additional studies/ records that were reviewed today include: . Review of the above records demonstrates:    ASSESSMENT AND PLAN:  1. Essential hypertension: Brooke White is doing very well. We will continue her same medications. We'll check labs in several weeks.  I have encouraged her to exercise on a regular basis. She'll continue to watch her salt intake. I'll  see her again in 6 months for follow-up office visit.  2.  Mitral valve prolapse: Patient has a very soft murmur. I did not hear a mitral valve click. We will continue to follow.  3. Hyperlipidemia:   Continue current dose of atorvastatin. We'll check fasting lipids on September 13   Current medicines are reviewed at length with the patient today.  The patient does not have concerns regarding medicines.  The following changes have been made:  no change   Disposition:   FU with me in 1 year .      Signed, Nahser, Wonda Cheng, MD  02/08/2015 4:47 PM    [facility] [place], [place], [place]  [address] Phone: [phone]; Fax: [phone]

## 2015-02-09 ENCOUNTER — Encounter: Payer: Self-pay | Admitting: Cardiovascular Disease

## 2015-02-28 ENCOUNTER — Other Ambulatory Visit (INDEPENDENT_AMBULATORY_CARE_PROVIDER_SITE_OTHER): Payer: 59 | Admitting: *Deleted

## 2015-02-28 DIAGNOSIS — I8390 Asymptomatic varicose veins of unspecified lower extremity: Secondary | ICD-10-CM

## 2015-02-28 DIAGNOSIS — I341 Nonrheumatic mitral (valve) prolapse: Secondary | ICD-10-CM | POA: Diagnosis not present

## 2015-02-28 DIAGNOSIS — E785 Hyperlipidemia, unspecified: Secondary | ICD-10-CM | POA: Diagnosis not present

## 2015-02-28 DIAGNOSIS — I868 Varicose veins of other specified sites: Secondary | ICD-10-CM | POA: Diagnosis not present

## 2015-02-28 DIAGNOSIS — I10 Essential (primary) hypertension: Secondary | ICD-10-CM | POA: Diagnosis not present

## 2015-02-28 LAB — HEPATIC FUNCTION PANEL
ALK PHOS: 78 U/L (ref 39–117)
ALT: 31 U/L (ref 0–35)
AST: 23 U/L (ref 0–37)
Albumin: 4.6 g/dL (ref 3.5–5.2)
BILIRUBIN DIRECT: 0.1 mg/dL (ref 0.0–0.3)
TOTAL PROTEIN: 7.6 g/dL (ref 6.0–8.3)
Total Bilirubin: 0.6 mg/dL (ref 0.2–1.2)

## 2015-02-28 LAB — BASIC METABOLIC PANEL
BUN: 15 mg/dL (ref 6–23)
CALCIUM: 10.4 mg/dL (ref 8.4–10.5)
CHLORIDE: 101 meq/L (ref 96–112)
CO2: 31 mEq/L (ref 19–32)
CREATININE: 0.82 mg/dL (ref 0.40–1.20)
GFR: 76.3 mL/min (ref >60.00)
Glucose, Bld: 104 mg/dL — ABNORMAL HIGH (ref 70–99)
Potassium: 4.1 mEq/L (ref 3.5–5.1)
Sodium: 141 mEq/L (ref 135–145)

## 2015-02-28 LAB — LIPID PANEL
CHOLESTEROL: 196 mg/dL (ref 0–200)
HDL: 61.2 mg/dL (ref >39.00)
LDL CALC: 113 mg/dL — AB (ref 0–99)
NonHDL: 134.7
Total CHOL/HDL Ratio: 3
Triglycerides: 108 mg/dL (ref 0.0–149.0)
VLDL: 21.6 mg/dL (ref 0.0–40.0)

## 2015-02-28 NOTE — Addendum Note (Signed)
Addended by: Eulis Foster on: 02/28/2015 11:59 AM   Modules accepted: Orders

## 2015-03-02 ENCOUNTER — Telehealth: Payer: Self-pay | Admitting: Cardiovascular Disease

## 2015-03-02 NOTE — Telephone Encounter (Signed)
The pt has been given her lab results. She verbalized understanding.

## 2015-03-02 NOTE — Telephone Encounter (Signed)
Pt rtn call to nurse for Nahser-pls call after 330p @ [phone]

## 2015-09-04 ENCOUNTER — Other Ambulatory Visit: Payer: Self-pay | Admitting: *Deleted

## 2015-09-04 DIAGNOSIS — I10 Essential (primary) hypertension: Secondary | ICD-10-CM

## 2015-09-04 MED ORDER — POTASSIUM CHLORIDE ER 10 MEQ PO TBCR: 10.0000 meq | EXTENDED_RELEASE_TABLET | Freq: Two times a day (BID) | ORAL | Status: DC

## 2015-09-04 MED ORDER — HYDROCHLOROTHIAZIDE 25 MG PO TABS: 25.0000 mg | ORAL_TABLET | Freq: Every day | ORAL | Status: DC

## 2015-09-04 MED ORDER — ATORVASTATIN CALCIUM 20 MG PO TABS: 20.0000 mg | ORAL_TABLET | Freq: Every day | ORAL | Status: DC

## 2015-09-28 ENCOUNTER — Other Ambulatory Visit: Payer: Self-pay

## 2015-09-28 DIAGNOSIS — Z1231 Encounter for screening mammogram for malignant neoplasm of breast: Secondary | ICD-10-CM

## 2015-10-24 ENCOUNTER — Ambulatory Visit
Admission: RE | Admit: 2015-10-24 | Discharge: 2015-10-24 | Disposition: A | Discharge status: Home / Self Care | Payer: 59 | Source: Ambulatory Visit

## 2015-10-24 DIAGNOSIS — Z1231 Encounter for screening mammogram for malignant neoplasm of breast: Secondary | ICD-10-CM

## 2016-02-12 ENCOUNTER — Encounter: Payer: Self-pay | Admitting: Cardiovascular Disease

## 2016-02-12 ENCOUNTER — Ambulatory Visit (INDEPENDENT_AMBULATORY_CARE_PROVIDER_SITE_OTHER): Payer: 59 | Admitting: Cardiovascular Disease

## 2016-02-12 VITALS — BP 134/100 | HR 60 | Ht 64.0 in | Wt 163.8 lb

## 2016-02-12 DIAGNOSIS — I10 Essential (primary) hypertension: Secondary | ICD-10-CM

## 2016-02-12 DIAGNOSIS — E785 Hyperlipidemia, unspecified: Secondary | ICD-10-CM | POA: Diagnosis not present

## 2016-02-12 LAB — COMPREHENSIVE METABOLIC PANEL
ALBUMIN: 4.4 g/dL (ref 3.6–5.1)
ALT: 28 U/L (ref 6–29)
AST: 22 U/L (ref 10–35)
Alkaline Phosphatase: 66 U/L (ref 33–130)
BILIRUBIN TOTAL: 0.7 mg/dL (ref 0.2–1.2)
BUN: 13 mg/dL (ref 7–25)
CALCIUM: 9.5 mg/dL (ref 8.6–10.4)
CHLORIDE: 103 mmol/L (ref 98–110)
CO2: 28 mmol/L (ref 20–31)
Creat: 0.81 mg/dL (ref 0.50–1.05)
Glucose, Bld: 87 mg/dL (ref 65–99)
POTASSIUM: 3.6 mmol/L (ref 3.5–5.3)
Sodium: 141 mmol/L (ref 135–146)
Total Protein: 6.7 g/dL (ref 6.1–8.1)

## 2016-02-12 LAB — LIPID PANEL
CHOL/HDL RATIO: 2.3 ratio (ref <=5.0)
CHOLESTEROL: 211 mg/dL — AB (ref 125–200)
HDL: 93 mg/dL (ref >=46)
LDL Cholesterol: 105 mg/dL (ref <130)
TRIGLYCERIDES: 66 mg/dL (ref <150)
VLDL: 13 mg/dL (ref <30)

## 2016-02-12 NOTE — Patient Instructions (Signed)

## 2016-02-12 NOTE — Progress Notes (Signed)
Cardiology Office Note   Date:  02/12/2016   ID:  Brooke White, DOB 1955-11-13, MRN [id number]  PCP:  Reginia Naas, MD  Cardiologist:   Mertie Moores, MD   Chief Complaint  Patient presents with  . Mitral Valve Prolapse   1. Mitral Valve prolapse 2. Hyperlipidemia 3. Essential Hypertension:   History of Present Illness:  Brooke White is a 60 year old female with a history of mitral valve prolapse, hyperlipidemia, and hypertension. She's done very well since I last saw her a year and half ago. She has not had any episodes of chest pain or shortness of breath. She's been able to do all of her normal activities without any significant problems.  December 09, 2012:  No new cardiac complaints. She is having a hysterectomy. She has uterine polyps and is having lots of bleeding.  She has a grandson as of last October. She is not exercising much recently.   December 14, 2013:  She has had a hysterectomy last July. She has had back pain and is in physical therapy. She has lost weight ( which she had gained after surgery). No CP or dyspnea.    History of Present Illness: Brooke White is a 60 y.o. female who presents for follow up of her MVP.   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  Aug. 24 2016:  Has gained some weight. Started exercising 10 days ago  . Had some numbness in her right lower leg for several days. Has resolved since she has started exercising .   Aug. 28 , 2017:  Brooke White is seen today for follow up of her HTN We Started her on hydrochlorthiazide as well as potassium chloride. She was having lots of leg cramps and Toprol that night and stopped the HCTZ. She's continued the potassium at 10 mEq a night and this seems to have helped her leg cramps.  Blood pressure readings at home look good.   Past Medical History:  Diagnosis Date  . Elevated cholesterol   . HTN (hypertension)    blood pressure under control-meds stopped approx 2011  .  Hyperlipemia   . MVP (mitral valve prolapse) 01/27/2001   LAST ECHO 07/02/2005 Impression normal LV function, mild MVP with trivial MR, mild tricuspid regurgitation, trivial pulmonic insufficiency.   . Post-menopausal bleeding 2014   endometrial polyp    Past Surgical History:  Procedure Laterality Date  . BILATERAL SALPINGECTOMY Bilateral 12/29/2012   Procedure: BILATERAL SALPINGECTOMY;  Surgeon: Azalia Bilis, MD;  Location: [facility];  Service: Gynecology;  Laterality: Bilateral;  . BUNIONECTOMY  2004  . CYSTOSCOPY N/A 12/29/2012   Procedure: CYSTOSCOPY;  Surgeon: Azalia Bilis, MD;  Location: [facility];  Service: Gynecology;  Laterality: N/A;  started 1719  . DILATATION & CURRETTAGE/HYSTEROSCOPY WITH RESECTOCOPE N/A 08/03/2012   Procedure: [place] WITH RESECTOCOPE;  Surgeon: Azalia Bilis, MD;  Location: [facility];  Service: Gynecology;  Laterality: N/A;  . DILATION AND CURETTAGE OF UTERUS  2005  . HYSTEROSCOPY  07/2012   2007   . ROBOTIC ASSISTED TOTAL HYSTERECTOMY N/A 12/29/2012   Procedure: ROBOTIC ASSISTED TOTAL HYSTERECTOMY;  Surgeon: Azalia Bilis, MD;  Location: [facility];  Service: Gynecology;  Laterality: N/A;  . ROOT CANAL    . TUBAL LIGATION  2002  . VEIN SURGERY Bilateral      Current Outpatient Prescriptions  Medication Sig Dispense Refill  . atorvastatin (LIPITOR) 20 MG tablet Take 1 tablet (20 mg total) by  mouth daily. 90 tablet 1  . Multiple Vitamins-Minerals (MULTIVITAMIN WITH MINERALS) tablet Take 1 tablet by mouth daily.    . potassium chloride (K-DUR) 10 MEQ tablet Take 1 tablet (10 mEq total) by mouth 2 (two) times daily. 180 tablet 1   No current facility-administered medications for this visit.     Allergies:   Review of patient's allergies indicates no known allergies.    Social History:  The patient  reports that she has never smoked. She has never used smokeless tobacco. She reports that she does not drink alcohol or use drugs.   Family History:  The  patient's family history is not on file.    ROS:  Please see the history of present illness.    Review of Systems: Constitutional:  denies fever, chills, diaphoresis, appetite change and fatigue.  HEENT: denies photophobia, eye pain, redness, hearing loss, ear pain, congestion, sore throat, rhinorrhea, sneezing, neck pain, neck stiffness and tinnitus.  Respiratory: denies SOB, DOE, cough, chest tightness, and wheezing.  Cardiovascular: denies chest pain, palpitations and leg swelling.  Gastrointestinal: denies nausea, vomiting, abdominal pain, diarrhea, constipation, blood in stool.  Genitourinary: denies dysuria, urgency, frequency, hematuria, flank pain and difficulty urinating.  Musculoskeletal: denies  myalgias, back pain, joint swelling, arthralgias and gait problem.   Skin: denies pallor, rash and wound.  Neurological: denies dizziness, seizures, syncope, weakness, light-headedness, numbness and headaches.   Hematological: denies adenopathy, easy bruising, personal or family bleeding history.  Psychiatric/ Behavioral: denies suicidal ideation, mood changes, confusion, nervousness, sleep disturbance and agitation.       All other systems are reviewed and negative.    PHYSICAL EXAM: VS:  BP (!) 134/100 (BP Location: Left Arm, Patient Position: Sitting, Cuff Size: Normal)   Pulse 60   Ht 5\' 4"  (1.626 m)   Wt 163 lb 12.8 oz (74.3 kg)   LMP 03/15/2012   BMI 28.12 kg/m  , BMI Body mass index is 28.12 kg/m. GEN: Well nourished, well developed, in no acute distress  HEENT: normal  Neck: no JVD, carotid bruits, or masses Cardiac: RRR;  Soft systolic  murmur, rubs, or gallops,no edema  Respiratory:  clear to auscultation bilaterally, normal work of breathing GI: soft, nontender, nondistended, + BS MS: no deformity or atrophy  Skin: warm and dry, no rash Neuro:  Strength and sensation are intact Psych: normal   EKG:  EKG is ordered today. The ekg ordered today demonstrates   NSR at  60. LAD  Recent Labs: 02/28/2015: ALT 31; BUN 15; Creatinine, Ser 0.82; Potassium 4.1; Sodium 141    Lipid Panel    Component Value Date/Time   CHOL 196 02/28/2015 1159   TRIG 108.0 02/28/2015 1159   HDL 61.20 02/28/2015 1159   CHOLHDL 3 02/28/2015 1159   VLDL 21.6 02/28/2015 1159   LDLCALC 113 (H) 02/28/2015 1159      Wt Readings from Last 3 Encounters:  02/12/16 163 lb 12.8 oz (74.3 kg)  02/08/15 167 lb (75.8 kg)  08/12/14 164 lb 6.4 oz (74.6 kg)      Other studies Reviewed: Additional studies/ records that were reviewed today include: . Review of the above records demonstrates:    ASSESSMENT AND PLAN:  1. Essential hypertension: Brooke White is doing  Ok She did not tolerate HCTZ 25 mg a day. She had severe leg and toe cramps. She stopped the HCTZ. She has continued the potassium at one half the prescribed dose-10 mEq at a day.  Her blood pressure is only  minimally elevated here in the office.  I've encouraged her to work on improved diet and exercise program. I think that she'll be able to control her blood pressure with dietary changes and exercise. We will see her again in 6 months for follow-up visit.   2.  Mitral valve prolapse: Patient has a very soft murmur. I did not hear a mitral valve click. We will continue to follow.  3. Hyperlipidemia:   Continue current dose of atorvastatin.    Current medicines are reviewed at length with the patient today.  The patient does not have concerns regarding medicines.  The following changes have been made:  no change   Disposition:   FU with me in 1 year .      Signed, Mertie Moores, MD  02/12/2016 4:31 PM    [facility] [address], [place], [place]  [address] Phone: [phone]; Fax: [phone]

## 2016-02-27 ENCOUNTER — Other Ambulatory Visit: Payer: Self-pay | Admitting: Cardiovascular Disease

## 2016-03-30 ENCOUNTER — Other Ambulatory Visit: Payer: Self-pay | Admitting: Cardiovascular Disease

## 2016-03-30 DIAGNOSIS — I10 Essential (primary) hypertension: Secondary | ICD-10-CM

## 2016-06-13 ENCOUNTER — Telehealth: Payer: Self-pay | Admitting: Cardiovascular Disease

## 2016-06-13 NOTE — Telephone Encounter (Signed)
NEw Message  Pt call requesting to speak with RN. Pt states [employer] will not be working with [facility]. Pt would like to discuss.   Pt also would like to know if she would need to get labs completed when she comes for her f/u appt. Please call back to discuss

## 2016-06-13 NOTE — Telephone Encounter (Signed)
Spoke with pt and advised her that we are switching to [facility] due to this change with [facility].  Also advised pt that her fasting labs were recently checked so she should not need them redrawn again at upcoming appt.  Pt appreciative for assistance.

## 2016-08-15 ENCOUNTER — Ambulatory Visit (INDEPENDENT_AMBULATORY_CARE_PROVIDER_SITE_OTHER): Payer: 59 | Admitting: Cardiovascular Disease

## 2016-08-15 ENCOUNTER — Encounter: Payer: Self-pay | Admitting: Cardiovascular Disease

## 2016-08-15 VITALS — BP 130/100 | HR 68 | Ht 64.0 in | Wt 168.4 lb

## 2016-08-15 DIAGNOSIS — E782 Mixed hyperlipidemia: Secondary | ICD-10-CM | POA: Diagnosis not present

## 2016-08-15 DIAGNOSIS — I10 Essential (primary) hypertension: Secondary | ICD-10-CM | POA: Diagnosis not present

## 2016-08-15 LAB — COMPREHENSIVE METABOLIC PANEL
ALBUMIN: 4.6 g/dL (ref 3.5–5.5)
ALK PHOS: 94 IU/L (ref 39–117)
ALT: 31 IU/L (ref 0–32)
AST: 23 IU/L (ref 0–40)
Albumin/Globulin Ratio: 2.1 (ref 1.2–2.2)
BUN / CREAT RATIO: 18 (ref 9–23)
BUN: 15 mg/dL (ref 6–24)
Bilirubin Total: 0.5 mg/dL (ref 0.0–1.2)
CO2: 25 mmol/L (ref 18–29)
CREATININE: 0.82 mg/dL (ref 0.57–1.00)
Calcium: 9.7 mg/dL (ref 8.7–10.2)
Chloride: 101 mmol/L (ref 96–106)
GFR calc non Af Amer: 79 mL/min/{1.73_m2} (ref >59)
GFR, EST AFRICAN AMERICAN: 91 mL/min/{1.73_m2} (ref >59)
GLUCOSE: 104 mg/dL — AB (ref 65–99)
Globulin, Total: 2.2 g/dL (ref 1.5–4.5)
Potassium: 4 mmol/L (ref 3.5–5.2)
SODIUM: 142 mmol/L (ref 134–144)
TOTAL PROTEIN: 6.8 g/dL (ref 6.0–8.5)

## 2016-08-15 LAB — LIPID PANEL
CHOLESTEROL TOTAL: 195 mg/dL (ref 100–199)
Chol/HDL Ratio: 2.6 ratio units (ref 0.0–4.4)
HDL: 74 mg/dL (ref >39)
LDL Calculated: 108 mg/dL — ABNORMAL HIGH (ref 0–99)
Triglycerides: 67 mg/dL (ref 0–149)
VLDL CHOLESTEROL CAL: 13 mg/dL (ref 5–40)

## 2016-08-15 NOTE — Progress Notes (Signed)
Cardiology Office Note   Date:  08/15/2016   ID:  Brooke White, DOB [birth year], MRN [id number]  PCP:  Reginia Naas, MD  Cardiologist:   Mertie Moores, MD   Chief Complaint  Patient presents with  . Hypertension   1. Mitral Valve prolapse 2. Hyperlipidemia 3. Essential Hypertension:   History of Present Illness:  Brooke White is a 61 year old female with a history of mitral valve prolapse, hyperlipidemia, and hypertension. She's done very well since I last saw her a year and half ago. She has not had any episodes of chest pain or shortness of breath. She's been able to do all of her normal activities without any significant problems.  December 09, 2012:  No new cardiac complaints. She is having a hysterectomy. She has uterine polyps and is having lots of bleeding.  She has a grandson as of last October. She is not exercising much recently.   December 14, 2013:  She has had a hysterectomy last July. She has had back pain and is in physical therapy. She has lost weight ( which she had gained after surgery). No CP or dyspnea.    History of Present Illness: Brooke White is a 61 y.o. female who presents for follow up of her MVP.   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  Aug. 24 2016:  Has gained some weight. Started exercising 10 days ago  . Had some numbness in her right lower leg for several days. Has resolved since she has started exercising .   Aug. 28 , 2017:  Otta is seen today for follow up of her HTN We Started her on hydrochlorthiazide as well as potassium chloride. She was having lots of leg cramps and Toprol that night and stopped the HCTZ. She's continued the potassium at 10 mEq a night and this seems to have helped her leg cramps.  Blood pressure readings at home look good.  August 15, 2016:  Brooke White is seen today .  We had stopped her HCTZ .  She has continued taking the potassium. She has had some low potassium levels and has leg cramps. The  potassium seems to help with her leg cramping. She brought her blood pressure readings from home. All of her readings are very well-controlled. She's feeling well. Has not had any episodes of chest pain or shortness of breath. Does not sleep well sometimes.  Drank hot tea last night    Past Medical History:  Diagnosis Date  . Elevated cholesterol   . HTN (hypertension)    blood pressure under control-meds stopped approx 2011  . Hyperlipemia   . MVP (mitral valve prolapse) 01/27/2001   LAST ECHO 07/02/2005 Impression normal LV function, mild MVP with trivial MR, mild tricuspid regurgitation, trivial pulmonic insufficiency.   . Post-menopausal bleeding 2014   endometrial polyp    Past Surgical History:  Procedure Laterality Date  . BILATERAL SALPINGECTOMY Bilateral 12/29/2012   Procedure: BILATERAL SALPINGECTOMY;  Surgeon: Azalia Bilis, MD;  Location: [facility];  Service: Gynecology;  Laterality: Bilateral;  . BUNIONECTOMY  2004  . CYSTOSCOPY N/A 12/29/2012   Procedure: CYSTOSCOPY;  Surgeon: Azalia Bilis, MD;  Location: [facility];  Service: Gynecology;  Laterality: N/A;  started 1719  . DILATATION & CURRETTAGE/HYSTEROSCOPY WITH RESECTOCOPE N/A 08/03/2012   Procedure: Pollyann Glen WITH RESECTOCOPE;  Surgeon: Azalia Bilis, MD;  Location: [facility];  Service: Gynecology;  Laterality: N/A;  . DILATION AND CURETTAGE OF UTERUS  2005  .  HYSTEROSCOPY  07/2012   2007   . ROBOTIC ASSISTED TOTAL HYSTERECTOMY N/A 12/29/2012   Procedure: ROBOTIC ASSISTED TOTAL HYSTERECTOMY;  Surgeon: Azalia Bilis, MD;  Location: [facility];  Service: Gynecology;  Laterality: N/A;  . ROOT CANAL    . TUBAL LIGATION  2002  . VEIN SURGERY Bilateral      Current Outpatient Prescriptions  Medication Sig Dispense Refill  . atorvastatin (LIPITOR) 20 MG tablet Take 1 tablet (20 mg total) by mouth daily. 90 tablet 3  . Multiple Vitamins-Minerals (MULTIVITAMIN WITH MINERALS) tablet Take 1 tablet by mouth daily.    .  potassium chloride (K-DUR) 10 MEQ tablet TAKE 1 TABLET(10 MEQ) BY MOUTH TWICE DAILY (Patient taking differently: TAKE 1 TABLET BY MOUTH DAILY) 180 tablet 2   No current facility-administered medications for this visit.     Allergies:   Patient has no known allergies.    Social History:  The patient  reports that she has never smoked. She has never used smokeless tobacco. She reports that she does not drink alcohol or use drugs.   Family History:  The patient's family history is not on file.    ROS:  Please see the history of present illness.    Review of Systems: Constitutional:  denies fever, chills, diaphoresis, appetite change and fatigue.  HEENT: denies photophobia, eye pain, redness, hearing loss, ear pain, congestion, sore throat, rhinorrhea, sneezing, neck pain, neck stiffness and tinnitus.  Respiratory: denies SOB, DOE, cough, chest tightness, and wheezing.  Cardiovascular: denies chest pain, palpitations and leg swelling.  Gastrointestinal: denies nausea, vomiting, abdominal pain, diarrhea, constipation, blood in stool.  Genitourinary: denies dysuria, urgency, frequency, hematuria, flank pain and difficulty urinating.  Musculoskeletal: denies  myalgias, back pain, joint swelling, arthralgias and gait problem.   Skin: denies pallor, rash and wound.  Neurological: denies dizziness, seizures, syncope, weakness, light-headedness, numbness and headaches.   Hematological: denies adenopathy, easy bruising, personal or family bleeding history.  Psychiatric/ Behavioral: denies suicidal ideation, mood changes, confusion, nervousness, sleep disturbance and agitation.       All other systems are reviewed and negative.    PHYSICAL EXAM: VS:  BP (!) 130/100 (BP Location: Right Arm, Patient Position: Sitting, Cuff Size: Normal)   Pulse 68   Ht 5\' 4"  (1.626 m)   Wt 168 lb 6.4 oz (76.4 kg)   LMP 03/15/2012   SpO2 96%   BMI 28.91 kg/m  , BMI Body mass index is 28.91 kg/m. GEN:  Well nourished, well developed, in no acute distress  HEENT: normal  Neck: no JVD, carotid bruits, or masses Cardiac: RRR;  Soft systolic  murmur, rubs, or gallops,no edema  Respiratory:  clear to auscultation bilaterally, normal work of breathing GI: soft, nontender, nondistended, + BS MS: no deformity or atrophy  Skin: warm and dry, no rash Neuro:  Strength and sensation are intact Psych: normal   EKG:  EKG is not ordered today.   Recent Labs: 02/12/2016: ALT 28; BUN 13; Creat 0.81; Potassium 3.6; Sodium 141    Lipid Panel    Component Value Date/Time   CHOL 211 (H) 02/12/2016 1647   TRIG 66 02/12/2016 1647   HDL 93 02/12/2016 1647   CHOLHDL 2.3 02/12/2016 1647   VLDL 13 02/12/2016 1647   LDLCALC 105 02/12/2016 1647      Wt Readings from Last 3 Encounters:  08/15/16 168 lb 6.4 oz (76.4 kg)  02/12/16 163 lb 12.8 oz (74.3 kg)  02/08/15 167 lb (75.8 kg)  Other studies Reviewed: Additional studies/ records that were reviewed today include: . Review of the above records demonstrates:    ASSESSMENT AND PLAN:  1. Essential hypertension: Emberlie is well  Ok BP has been well controlled.   She did not tolerate the HCTZ and it has been stopped.   She has continued the [place] because of leg cramps.   Will check BMP today .    Her blood pressure is only minimally elevated here in the office. She has been controlling her BP with diet and exercise   We will see her again in 1 year .   2.  Mitral valve prolapse: Patient has a very soft murmur. I did not hear a mitral valve click.    3. Hyperlipidemia:   Continue current dose of atorvastatin.  Will check labs today    Current medicines are reviewed at length with the patient today.  The patient does not have concerns regarding medicines.  The following changes have been made:  no change   Disposition:   FU with me in 1 year .      Signed, Mertie Moores, MD  08/15/2016 8:31 AM    [facility] [address], [place], [place]  [address] Phone: [phone]; Fax: [phone]

## 2016-08-15 NOTE — Patient Instructions (Signed)

## 2016-09-19 DIAGNOSIS — H903 Sensorineural hearing loss, bilateral: Secondary | ICD-10-CM | POA: Diagnosis not present

## 2017-03-08 ENCOUNTER — Other Ambulatory Visit: Payer: Self-pay | Admitting: Cardiovascular Disease

## 2017-04-06 ENCOUNTER — Other Ambulatory Visit: Payer: Self-pay | Admitting: Cardiovascular Disease

## 2017-04-06 DIAGNOSIS — I10 Essential (primary) hypertension: Secondary | ICD-10-CM

## 2017-06-06 DIAGNOSIS — H40051 Ocular hypertension, right eye: Secondary | ICD-10-CM | POA: Diagnosis not present

## 2017-07-21 DIAGNOSIS — E7800 Pure hypercholesterolemia, unspecified: Secondary | ICD-10-CM | POA: Diagnosis not present

## 2017-07-21 DIAGNOSIS — I10 Essential (primary) hypertension: Secondary | ICD-10-CM | POA: Diagnosis not present

## 2017-07-21 DIAGNOSIS — Z1211 Encounter for screening for malignant neoplasm of colon: Secondary | ICD-10-CM | POA: Diagnosis not present

## 2017-07-21 DIAGNOSIS — Z0000 Encounter for general adult medical examination without abnormal findings: Secondary | ICD-10-CM | POA: Diagnosis not present

## 2017-10-13 DIAGNOSIS — J342 Deviated nasal septum: Secondary | ICD-10-CM | POA: Diagnosis not present

## 2017-10-13 DIAGNOSIS — J343 Hypertrophy of nasal turbinates: Secondary | ICD-10-CM | POA: Diagnosis not present

## 2017-10-13 DIAGNOSIS — J3489 Other specified disorders of nose and nasal sinuses: Secondary | ICD-10-CM | POA: Diagnosis not present

## 2017-10-28 DIAGNOSIS — Z13820 Encounter for screening for osteoporosis: Secondary | ICD-10-CM | POA: Diagnosis not present

## 2017-10-28 DIAGNOSIS — Z780 Asymptomatic menopausal state: Secondary | ICD-10-CM | POA: Diagnosis not present

## 2017-10-28 DIAGNOSIS — M8588 Other specified disorders of bone density and structure, other site: Secondary | ICD-10-CM | POA: Diagnosis not present

## 2017-11-26 DIAGNOSIS — J343 Hypertrophy of nasal turbinates: Secondary | ICD-10-CM | POA: Diagnosis not present

## 2017-11-26 DIAGNOSIS — G479 Sleep disorder, unspecified: Secondary | ICD-10-CM | POA: Diagnosis not present

## 2017-11-26 DIAGNOSIS — J342 Deviated nasal septum: Secondary | ICD-10-CM | POA: Diagnosis not present

## 2017-12-09 DIAGNOSIS — J3489 Other specified disorders of nose and nasal sinuses: Secondary | ICD-10-CM | POA: Diagnosis not present

## 2017-12-09 DIAGNOSIS — M950 Acquired deformity of nose: Secondary | ICD-10-CM | POA: Diagnosis not present

## 2017-12-09 DIAGNOSIS — J342 Deviated nasal septum: Secondary | ICD-10-CM | POA: Diagnosis not present

## 2018-03-30 DIAGNOSIS — H903 Sensorineural hearing loss, bilateral: Secondary | ICD-10-CM | POA: Diagnosis not present

## 2018-04-10 DIAGNOSIS — H903 Sensorineural hearing loss, bilateral: Secondary | ICD-10-CM | POA: Diagnosis not present

## 2018-06-01 DIAGNOSIS — M950 Acquired deformity of nose: Secondary | ICD-10-CM | POA: Diagnosis not present

## 2018-06-01 DIAGNOSIS — J3489 Other specified disorders of nose and nasal sinuses: Secondary | ICD-10-CM | POA: Diagnosis not present

## 2018-06-01 DIAGNOSIS — J342 Deviated nasal septum: Secondary | ICD-10-CM | POA: Diagnosis not present

## 2018-06-29 DIAGNOSIS — H5201 Hypermetropia, right eye: Secondary | ICD-10-CM | POA: Diagnosis not present

## 2018-06-29 DIAGNOSIS — H40053 Ocular hypertension, bilateral: Secondary | ICD-10-CM | POA: Diagnosis not present

## 2018-07-23 DIAGNOSIS — E7800 Pure hypercholesterolemia, unspecified: Secondary | ICD-10-CM | POA: Diagnosis not present

## 2018-07-23 DIAGNOSIS — N951 Menopausal and female climacteric states: Secondary | ICD-10-CM | POA: Diagnosis not present

## 2018-07-23 DIAGNOSIS — G479 Sleep disorder, unspecified: Secondary | ICD-10-CM | POA: Diagnosis not present

## 2018-07-23 DIAGNOSIS — Z0000 Encounter for general adult medical examination without abnormal findings: Secondary | ICD-10-CM | POA: Diagnosis not present

## 2020-09-05 ENCOUNTER — Other Ambulatory Visit: Payer: Self-pay | Admitting: Family Medicine

## 2020-09-05 DIAGNOSIS — Z0000 Encounter for general adult medical examination without abnormal findings: Secondary | ICD-10-CM

## 2020-11-21 ENCOUNTER — Ambulatory Visit
Admission: RE | Admit: 2020-11-21 | Discharge: 2020-11-21 | Disposition: A | Discharge status: Home / Self Care | Payer: 59 | Source: Ambulatory Visit | Attending: Family Medicine | Admitting: Family Medicine

## 2020-11-21 ENCOUNTER — Ambulatory Visit: Payer: 59

## 2020-11-21 ENCOUNTER — Other Ambulatory Visit: Payer: Self-pay

## 2020-11-21 DIAGNOSIS — Z0000 Encounter for general adult medical examination without abnormal findings: Secondary | ICD-10-CM

## 2020-11-23 ENCOUNTER — Other Ambulatory Visit: Payer: Self-pay | Admitting: Family Medicine

## 2020-11-23 DIAGNOSIS — R928 Other abnormal and inconclusive findings on diagnostic imaging of breast: Secondary | ICD-10-CM

## 2020-12-14 ENCOUNTER — Ambulatory Visit
Admission: RE | Admit: 2020-12-14 | Discharge: 2020-12-14 | Disposition: A | Discharge status: Home / Self Care | Payer: Managed Care, Other (non HMO) | Source: Ambulatory Visit | Attending: Family Medicine | Admitting: Family Medicine

## 2020-12-14 ENCOUNTER — Other Ambulatory Visit: Payer: Self-pay

## 2020-12-14 DIAGNOSIS — R928 Other abnormal and inconclusive findings on diagnostic imaging of breast: Secondary | ICD-10-CM

## 2021-11-10 IMAGING — MG MM DIGITAL DIAGNOSTIC UNILAT*L* W/ TOMO W/ CAD
4 series · 4 of 12 positions shown · non-contrast
Comparison: Previous exam(s).

CLINICAL DATA: Staff patient returns after screening study for
evaluation of possible LEFT breast asymmetry.

EXAM:
DIGITAL DIAGNOSTIC UNILATERAL LEFT MAMMOGRAM WITH TOMOSYNTHESIS AND
CAD; ULTRASOUND LEFT BREAST LIMITED
TECHNIQUE: Left digital diagnostic mammography and breast tomosynthesis was
performed. The images were evaluated with computer-aided detection.;
Targeted ultrasound examination of the left breast was performed

[L MLO synth-2D]
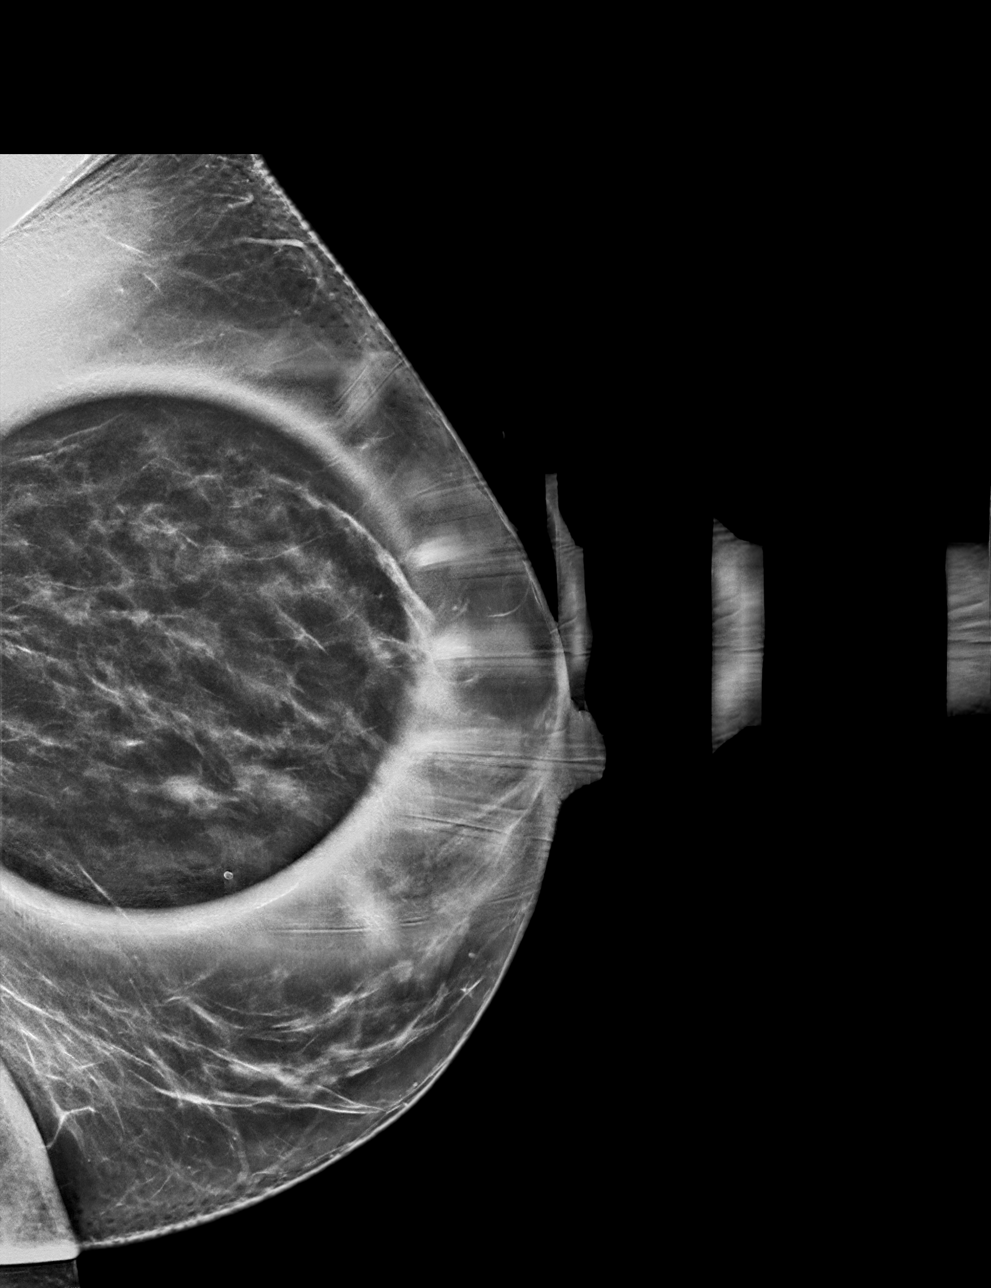

[L CC synth-2D]
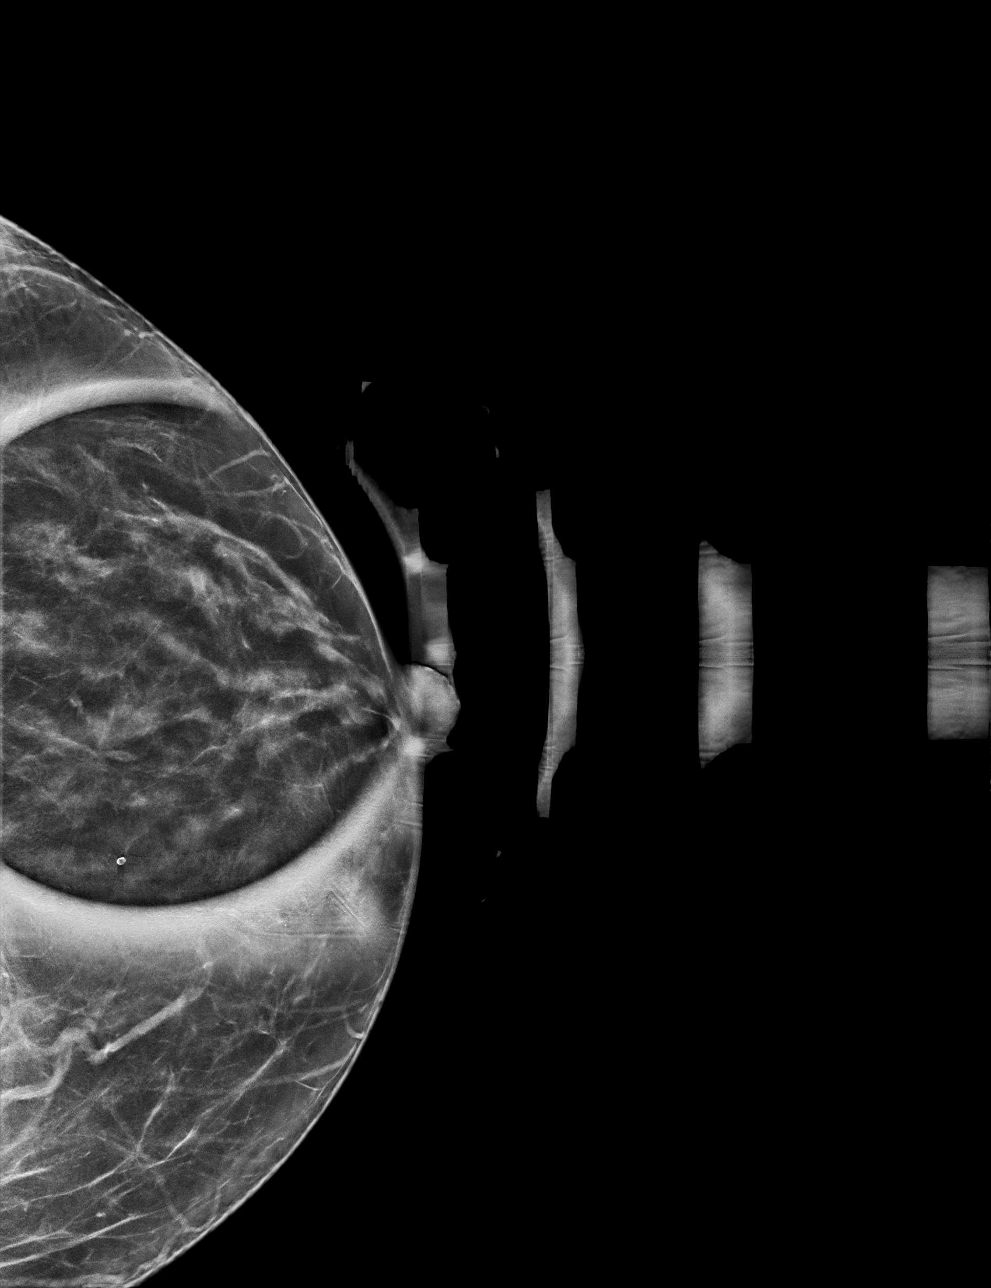

[L CC tomo · tomo slice 29/58.0]
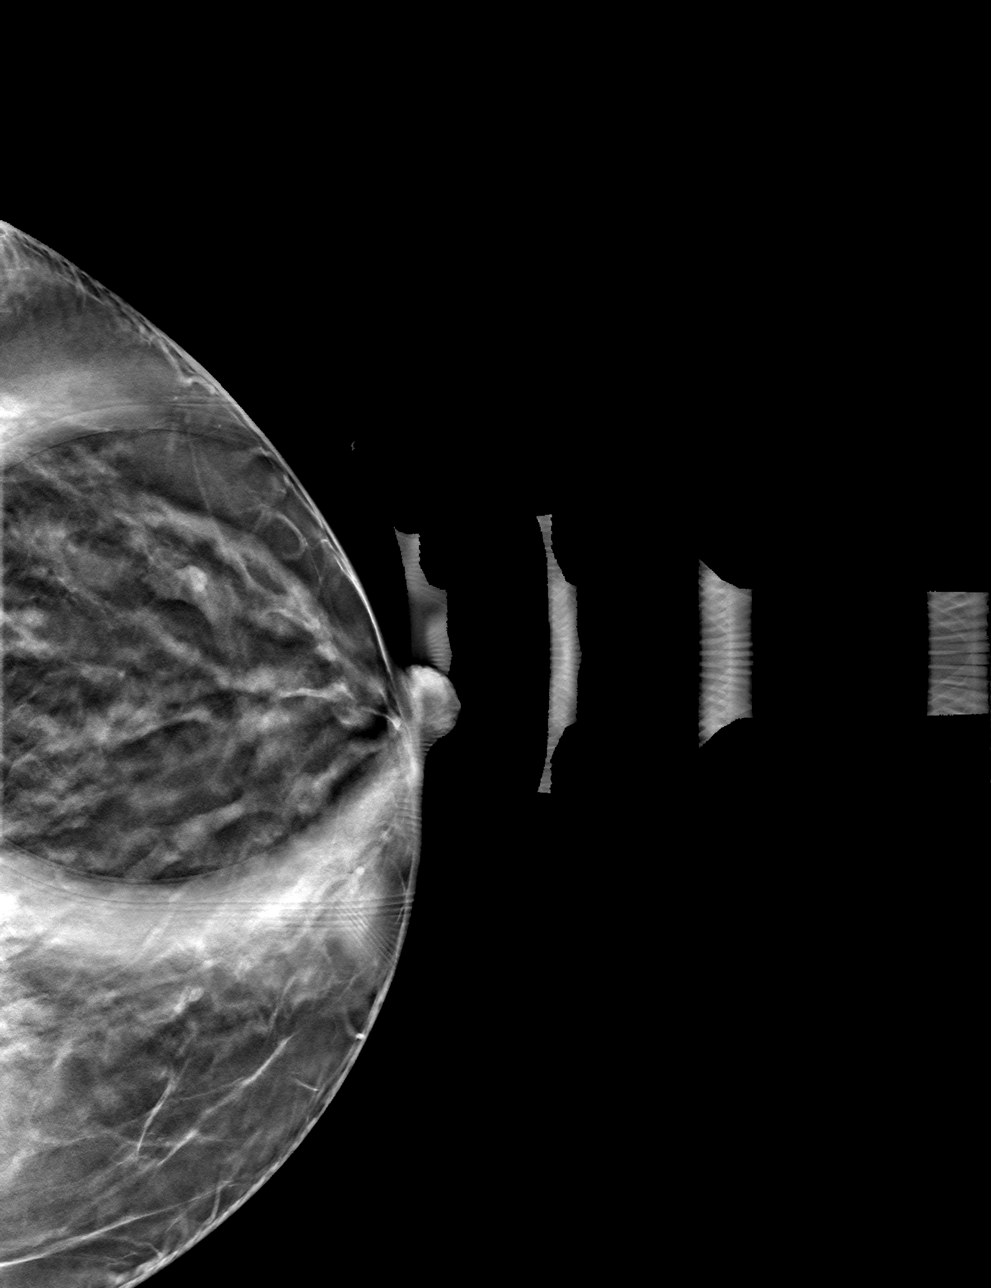

[L MLO tomo · tomo slice 35/69.0]
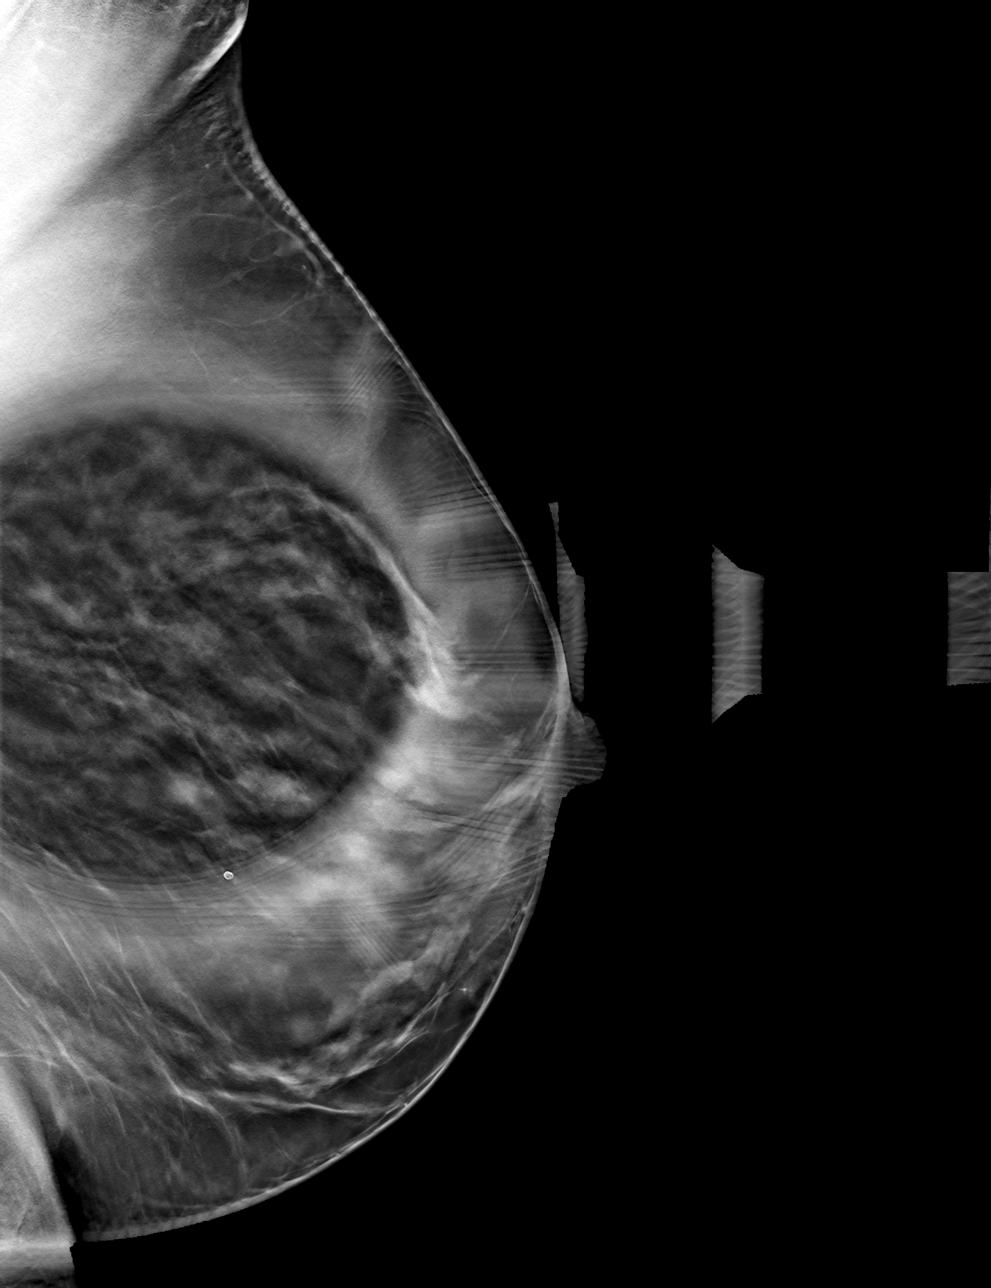

[4 of 12 positions shown; findings below may reference images not displayed]

ACR Breast Density Category c: The breast tissue is heterogeneously
dense, which may obscure small masses.
FINDINGS: Additional 2-D and 3-D images are performed. These views confirm
presence of a circumscribed partially obscured mass in the LOWER
OUTER QUADRANT of the LEFT breast.

Targeted ultrasound is performed, showing a mildly ectatic duct
without intraductal mass in the 4 o'clock location of the LEFT
breast 2 centimeters from the nipple, similar in appearance to the
mammographic abnormality. No solid mass or areas of acoustic
shadowing.
IMPRESSION: Benign LEFT breast duct ectasia.

RECOMMENDATION:
Screening mammogram in one year.(Code:D8-X-GCS)

I have discussed the findings and recommendations with the patient.
If applicable, a reminder letter will be sent to the patient
regarding the next appointment.

BI-RADS CATEGORY  2: Benign.

## 2021-12-24 ENCOUNTER — Other Ambulatory Visit: Payer: Self-pay | Admitting: Family Medicine

## 2021-12-24 DIAGNOSIS — Z1231 Encounter for screening mammogram for malignant neoplasm of breast: Secondary | ICD-10-CM

## 2022-01-02 ENCOUNTER — Ambulatory Visit: Payer: Managed Care, Other (non HMO)

## 2022-01-04 ENCOUNTER — Ambulatory Visit: Payer: Managed Care, Other (non HMO)

## 2022-01-09 ENCOUNTER — Ambulatory Visit
Admission: RE | Admit: 2022-01-09 | Discharge: 2022-01-09 | Disposition: A | Discharge status: Home / Self Care | Payer: Managed Care, Other (non HMO) | Source: Ambulatory Visit | Attending: Family Medicine | Admitting: Family Medicine

## 2022-01-09 DIAGNOSIS — Z1231 Encounter for screening mammogram for malignant neoplasm of breast: Secondary | ICD-10-CM

## 2022-06-26 ENCOUNTER — Other Ambulatory Visit: Payer: Self-pay

## 2022-09-18 ENCOUNTER — Other Ambulatory Visit: Payer: Self-pay

## 2022-12-11 ENCOUNTER — Other Ambulatory Visit: Payer: Self-pay | Admitting: Family Medicine

## 2022-12-11 DIAGNOSIS — Z1231 Encounter for screening mammogram for malignant neoplasm of breast: Secondary | ICD-10-CM

## 2022-12-16 ENCOUNTER — Other Ambulatory Visit: Payer: Self-pay | Admitting: Family Medicine

## 2022-12-16 DIAGNOSIS — M8580 Other specified disorders of bone density and structure, unspecified site: Secondary | ICD-10-CM

## 2023-01-13 ENCOUNTER — Ambulatory Visit
Admission: RE | Admit: 2023-01-13 | Discharge: 2023-01-13 | Disposition: A | Discharge status: Home / Self Care | Payer: Managed Care, Other (non HMO) | Source: Ambulatory Visit | Attending: Family Medicine | Admitting: Family Medicine

## 2023-01-13 DIAGNOSIS — Z1231 Encounter for screening mammogram for malignant neoplasm of breast: Secondary | ICD-10-CM

## 2023-06-26 ENCOUNTER — Ambulatory Visit
Admission: RE | Admit: 2023-06-26 | Discharge: 2023-06-26 | Disposition: A | Discharge status: Home / Self Care | Payer: Managed Care, Other (non HMO) | Source: Ambulatory Visit | Attending: Family Medicine | Admitting: Family Medicine

## 2023-06-26 DIAGNOSIS — M8580 Other specified disorders of bone density and structure, unspecified site: Secondary | ICD-10-CM

## 2023-12-23 ENCOUNTER — Other Ambulatory Visit: Payer: Self-pay | Admitting: Family Medicine

## 2023-12-23 DIAGNOSIS — Z0000 Encounter for general adult medical examination without abnormal findings: Secondary | ICD-10-CM

## 2024-01-14 ENCOUNTER — Ambulatory Visit

## 2024-01-19 ENCOUNTER — Ambulatory Visit
Admission: RE | Admit: 2024-01-19 | Discharge: 2024-01-19 | Disposition: A | Discharge status: Home / Self Care | Source: Ambulatory Visit | Attending: Family Medicine | Admitting: Family Medicine

## 2024-01-19 DIAGNOSIS — Z0000 Encounter for general adult medical examination without abnormal findings: Secondary | ICD-10-CM

## 2024-04-22 DIAGNOSIS — G4733 Obstructive sleep apnea (adult) (pediatric): Secondary | ICD-10-CM | POA: Diagnosis not present

## 2024-04-22 DIAGNOSIS — R400 Somnolence: Secondary | ICD-10-CM | POA: Diagnosis not present

## 2024-04-27 ENCOUNTER — Other Ambulatory Visit: Payer: Self-pay

## 2024-04-27 ENCOUNTER — Other Ambulatory Visit (HOSPITAL_COMMUNITY): Payer: Self-pay

## 2024-04-27 MED ORDER — DORZOLAMIDE HCL-TIMOLOL MAL 2-0.5 % OP SOLN
1.0000 [drp] | Freq: Two times a day (BID) | OPHTHALMIC | 4 refills | Status: AC
  Filled 2024-04-27: qty 10, 50d supply, fill #0
  Filled 2024-06-16: qty 10, 50d supply, fill #1

## 2024-04-27 MED ORDER — ATORVASTATIN CALCIUM 40 MG PO TABS
40.0000 mg | ORAL_TABLET | Freq: Every day | ORAL | 3 refills | Status: AC
  Filled 2024-04-27 (×2): qty 90, 90d supply, fill #0
  Filled 2024-07-21: qty 90, 90d supply, fill #1

## 2024-04-28 ENCOUNTER — Other Ambulatory Visit (HOSPITAL_COMMUNITY): Payer: Self-pay

## 2024-04-28 MED ORDER — BISACODYL 5 MG PO TBEC
DELAYED_RELEASE_TABLET | ORAL | 0 refills | Status: AC
  Filled 2024-04-28: qty 4, 2d supply, fill #0

## 2024-04-28 MED ORDER — PEG 3350-KCL-NA BICARB-NACL 420 G PO SOLR
ORAL | 0 refills | Status: AC
  Filled 2024-04-28: qty 4000, 2d supply, fill #0

## 2024-05-31 DIAGNOSIS — Z860101 Personal history of adenomatous and serrated colon polyps: Secondary | ICD-10-CM | POA: Diagnosis not present

## 2024-05-31 DIAGNOSIS — Q438 Other specified congenital malformations of intestine: Secondary | ICD-10-CM | POA: Diagnosis not present

## 2024-05-31 DIAGNOSIS — Z09 Encounter for follow-up examination after completed treatment for conditions other than malignant neoplasm: Secondary | ICD-10-CM | POA: Diagnosis not present

## 2024-05-31 DIAGNOSIS — D124 Benign neoplasm of descending colon: Secondary | ICD-10-CM | POA: Diagnosis not present

## 2024-06-02 DIAGNOSIS — D124 Benign neoplasm of descending colon: Secondary | ICD-10-CM | POA: Diagnosis not present

## 2024-06-03 ENCOUNTER — Other Ambulatory Visit (HOSPITAL_COMMUNITY): Payer: Self-pay

## 2024-06-03 DIAGNOSIS — R142 Eructation: Secondary | ICD-10-CM | POA: Diagnosis not present

## 2024-06-03 DIAGNOSIS — K219 Gastro-esophageal reflux disease without esophagitis: Secondary | ICD-10-CM | POA: Diagnosis not present

## 2024-06-03 MED ORDER — OMEPRAZOLE 40 MG PO CPDR
40.0000 mg | DELAYED_RELEASE_CAPSULE | Freq: Every morning | ORAL | 3 refills | Status: AC
  Filled 2024-06-03: qty 30, 30d supply, fill #0
  Filled 2024-06-28: qty 30, 30d supply, fill #1

## 2024-07-21 ENCOUNTER — Other Ambulatory Visit (HOSPITAL_COMMUNITY): Payer: Self-pay
